# Patient Record
Sex: Female | Born: 1935 | Race: White | Hispanic: No
Health system: Midwestern US, Community
[De-identification: ages and names within clinical notes are randomized; demographics above are authoritative.]

## PROBLEM LIST (undated history)

## (undated) DIAGNOSIS — K219 Gastro-esophageal reflux disease without esophagitis: Secondary | ICD-10-CM

## (undated) DIAGNOSIS — D500 Iron deficiency anemia secondary to blood loss (chronic): Secondary | ICD-10-CM

## (undated) DIAGNOSIS — F0390 Unspecified dementia without behavioral disturbance: Secondary | ICD-10-CM

## (undated) DIAGNOSIS — N189 Chronic kidney disease, unspecified: Secondary | ICD-10-CM

## (undated) DIAGNOSIS — I10 Essential (primary) hypertension: Secondary | ICD-10-CM

## (undated) DIAGNOSIS — E039 Hypothyroidism, unspecified: Secondary | ICD-10-CM

## (undated) DIAGNOSIS — I2510 Atherosclerotic heart disease of native coronary artery without angina pectoris: Secondary | ICD-10-CM

## (undated) DIAGNOSIS — C189 Malignant neoplasm of colon, unspecified: Secondary | ICD-10-CM

## (undated) HISTORY — DX: Hypothyroidism, unspecified: E03.9

## (undated) HISTORY — PX: HEMICOLECTOMY: SHX854

## (undated) HISTORY — DX: Malignant neoplasm of colon, unspecified: C18.9

## (undated) HISTORY — DX: Iron deficiency anemia secondary to blood loss (chronic): D50.0

## (undated) HISTORY — DX: Unspecified dementia, unspecified severity, without behavioral disturbance, psychotic disturbance, mood disturbance, and anxiety: F03.90

## (undated) HISTORY — PX: ABDOMINAL HYSTERECTOMY: SHX81

## (undated) HISTORY — DX: Atherosclerotic heart disease of native coronary artery without angina pectoris: I25.10

## (undated) HISTORY — DX: Essential (primary) hypertension: I10

## (undated) HISTORY — DX: Chronic kidney disease, unspecified: N18.9

## (undated) HISTORY — DX: Gastro-esophageal reflux disease without esophagitis: K21.9

---

## 2007-04-13 ENCOUNTER — Other Ambulatory Visit: Payer: Self-pay

## 2007-04-13 ENCOUNTER — Emergency Department: Payer: Self-pay | Admitting: Emergency Medicine

## 2008-10-25 ENCOUNTER — Emergency Department: Payer: Self-pay | Admitting: Emergency Medicine

## 2010-05-15 ENCOUNTER — Encounter: Payer: Self-pay | Admitting: Orthopedic Surgery

## 2010-05-20 ENCOUNTER — Encounter: Payer: Self-pay | Admitting: Orthopedic Surgery

## 2010-06-19 ENCOUNTER — Encounter: Payer: Self-pay | Admitting: Orthopedic Surgery

## 2010-07-20 ENCOUNTER — Encounter: Payer: Self-pay | Admitting: Orthopedic Surgery

## 2010-08-20 ENCOUNTER — Encounter: Payer: Self-pay | Admitting: Orthopedic Surgery

## 2011-11-24 ENCOUNTER — Emergency Department: Payer: Self-pay | Admitting: Emergency Medicine

## 2012-03-06 ENCOUNTER — Encounter: Payer: Self-pay | Admitting: Orthopedic Surgery

## 2012-07-14 ENCOUNTER — Encounter: Payer: Self-pay | Admitting: Internal Medicine

## 2012-09-04 ENCOUNTER — Inpatient Hospital Stay: Payer: Self-pay | Admitting: Specialist

## 2012-09-04 ENCOUNTER — Ambulatory Visit: Payer: Self-pay | Admitting: Internal Medicine

## 2012-09-04 LAB — COMPREHENSIVE METABOLIC PANEL
Albumin: 3.6 g/dL (ref 3.4–5.0)
Alkaline Phosphatase: 98 U/L (ref 50–136)
Anion Gap: 10 (ref 7–16)
BUN: 24 mg/dL — ABNORMAL HIGH (ref 7–18)
Bilirubin,Total: 0.4 mg/dL (ref 0.2–1.0)
Calcium, Total: 9.2 mg/dL (ref 8.5–10.1)
Chloride: 103 mmol/L (ref 98–107)
Co2: 24 mmol/L (ref 21–32)
Creatinine: 1.53 mg/dL — ABNORMAL HIGH (ref 0.60–1.30)
EGFR (African American): 35 — ABNORMAL LOW
EGFR (Non-African Amer.): 30 — ABNORMAL LOW
Glucose: 96 mg/dL (ref 65–99)
Osmolality: 278 (ref 275–301)
Potassium: 3.9 mmol/L (ref 3.5–5.1)
SGOT(AST): 19 U/L (ref 15–37)
SGPT (ALT): 14 U/L (ref 12–78)
Sodium: 137 mmol/L (ref 136–145)
Total Protein: 8.1 g/dL (ref 6.4–8.2)

## 2012-09-04 LAB — CBC WITH DIFFERENTIAL/PLATELET
Basophil #: 0.1 10*3/uL (ref 0.0–0.1)
Basophil %: 0.9 %
Eosinophil #: 0.5 10*3/uL (ref 0.0–0.7)
Eosinophil %: 6.5 %
HCT: 30.7 % — ABNORMAL LOW (ref 35.0–47.0)
HGB: 10 g/dL — ABNORMAL LOW (ref 12.0–16.0)
Lymphocyte #: 2.2 10*3/uL (ref 1.0–3.6)
Lymphocyte %: 28.4 %
MCH: 25.1 pg — ABNORMAL LOW (ref 26.0–34.0)
MCHC: 32.7 g/dL (ref 32.0–36.0)
MCV: 77 fL — ABNORMAL LOW (ref 80–100)
Monocyte #: 0.8 x10 3/mm (ref 0.2–0.9)
Monocyte %: 10.2 %
Neutrophil #: 4.2 10*3/uL (ref 1.4–6.5)
Neutrophil %: 54 %
Platelet: 322 10*3/uL (ref 150–440)
RBC: 3.99 10*6/uL (ref 3.80–5.20)
RDW: 16.9 % — ABNORMAL HIGH (ref 11.5–14.5)
WBC: 7.7 10*3/uL (ref 3.6–11.0)

## 2012-09-05 LAB — CBC WITH DIFFERENTIAL/PLATELET
Basophil #: 0.1 10*3/uL (ref 0.0–0.1)
Basophil %: 1.1 %
Eosinophil #: 0.7 10*3/uL (ref 0.0–0.7)
Eosinophil %: 12 %
HCT: 26.2 % — ABNORMAL LOW (ref 35.0–47.0)
HGB: 8.4 g/dL — ABNORMAL LOW (ref 12.0–16.0)
Lymphocyte #: 2 10*3/uL (ref 1.0–3.6)
Lymphocyte %: 37.3 %
MCH: 24.8 pg — ABNORMAL LOW (ref 26.0–34.0)
MCHC: 32.2 g/dL (ref 32.0–36.0)
MCV: 77 fL — ABNORMAL LOW (ref 80–100)
Monocyte #: 0.7 x10 3/mm (ref 0.2–0.9)
Monocyte %: 12.4 %
Neutrophil #: 2 10*3/uL (ref 1.4–6.5)
Neutrophil %: 37.2 %
Platelet: 243 10*3/uL (ref 150–440)
RBC: 3.4 10*6/uL — ABNORMAL LOW (ref 3.80–5.20)
RDW: 16.8 % — ABNORMAL HIGH (ref 11.5–14.5)
WBC: 5.4 10*3/uL (ref 3.6–11.0)

## 2012-09-05 LAB — BASIC METABOLIC PANEL
Anion Gap: 13 (ref 7–16)
BUN: 24 mg/dL — ABNORMAL HIGH (ref 7–18)
Calcium, Total: 8.4 mg/dL — ABNORMAL LOW (ref 8.5–10.1)
Chloride: 107 mmol/L (ref 98–107)
Co2: 21 mmol/L (ref 21–32)
Creatinine: 1.83 mg/dL — ABNORMAL HIGH (ref 0.60–1.30)
EGFR (African American): 28 — ABNORMAL LOW
EGFR (Non-African Amer.): 25 — ABNORMAL LOW
Glucose: 119 mg/dL — ABNORMAL HIGH (ref 65–99)
Osmolality: 286 (ref 275–301)
Potassium: 3.4 mmol/L — ABNORMAL LOW (ref 3.5–5.1)
Sodium: 141 mmol/L (ref 136–145)

## 2012-09-06 LAB — CBC WITH DIFFERENTIAL/PLATELET
Basophil #: 0.1 10*3/uL (ref 0.0–0.1)
Basophil %: 1.1 %
Eosinophil #: 0.6 10*3/uL (ref 0.0–0.7)
Eosinophil %: 11.1 %
HCT: 27.1 % — ABNORMAL LOW (ref 35.0–47.0)
HGB: 8.8 g/dL — ABNORMAL LOW (ref 12.0–16.0)
Lymphocyte #: 1.6 10*3/uL (ref 1.0–3.6)
Lymphocyte %: 28.6 %
MCH: 25 pg — ABNORMAL LOW (ref 26.0–34.0)
MCHC: 32.6 g/dL (ref 32.0–36.0)
MCV: 77 fL — ABNORMAL LOW (ref 80–100)
Monocyte #: 0.8 x10 3/mm (ref 0.2–0.9)
Monocyte %: 13.7 %
Neutrophil #: 2.6 10*3/uL (ref 1.4–6.5)
Neutrophil %: 45.5 %
Platelet: 243 10*3/uL (ref 150–440)
RBC: 3.53 10*6/uL — ABNORMAL LOW (ref 3.80–5.20)
RDW: 16.7 % — ABNORMAL HIGH (ref 11.5–14.5)
WBC: 5.6 10*3/uL (ref 3.6–11.0)

## 2012-09-06 LAB — BASIC METABOLIC PANEL
Anion Gap: 9 (ref 7–16)
BUN: 18 mg/dL (ref 7–18)
Calcium, Total: 8.8 mg/dL (ref 8.5–10.1)
Chloride: 107 mmol/L (ref 98–107)
Co2: 22 mmol/L (ref 21–32)
Creatinine: 1.57 mg/dL — ABNORMAL HIGH (ref 0.60–1.30)
EGFR (African American): 34 — ABNORMAL LOW
EGFR (Non-African Amer.): 30 — ABNORMAL LOW
Glucose: 91 mg/dL (ref 65–99)
Osmolality: 277 (ref 275–301)
Potassium: 3.5 mmol/L (ref 3.5–5.1)
Sodium: 138 mmol/L (ref 136–145)

## 2012-09-07 LAB — TSH: Thyroid Stimulating Horm: 9.9 u[IU]/mL — ABNORMAL HIGH

## 2012-09-07 LAB — WBCS, STOOL

## 2012-09-10 LAB — STOOL CULTURE

## 2012-09-10 LAB — CULTURE, BLOOD (SINGLE)

## 2014-01-08 ENCOUNTER — Ambulatory Visit: Payer: Self-pay | Admitting: Oncology

## 2014-01-08 LAB — COMPREHENSIVE METABOLIC PANEL
Albumin: 3.3 g/dL — ABNORMAL LOW (ref 3.4–5.0)
Alkaline Phosphatase: 92 U/L
Anion Gap: 12 (ref 7–16)
BUN: 27 mg/dL — ABNORMAL HIGH (ref 7–18)
Bilirubin,Total: 0.2 mg/dL (ref 0.2–1.0)
Calcium, Total: 9 mg/dL (ref 8.5–10.1)
Chloride: 101 mmol/L (ref 98–107)
Co2: 25 mmol/L (ref 21–32)
Creatinine: 1.94 mg/dL — ABNORMAL HIGH (ref 0.60–1.30)
EGFR (African American): 26 — ABNORMAL LOW
EGFR (Non-African Amer.): 23 — ABNORMAL LOW
Glucose: 106 mg/dL — ABNORMAL HIGH (ref 65–99)
Osmolality: 281 (ref 275–301)
Potassium: 4.4 mmol/L (ref 3.5–5.1)
SGOT(AST): 17 U/L (ref 15–37)
SGPT (ALT): 12 U/L (ref 12–78)
Sodium: 138 mmol/L (ref 136–145)
Total Protein: 8.2 g/dL (ref 6.4–8.2)

## 2014-01-08 LAB — CBC CANCER CENTER
Basophil #: 0.1 x10 3/mm (ref 0.0–0.1)
Basophil %: 1.2 %
Eosinophil #: 0.2 x10 3/mm (ref 0.0–0.7)
Eosinophil %: 4.1 %
HCT: 23.6 % — ABNORMAL LOW (ref 35.0–47.0)
HGB: 7.2 g/dL — ABNORMAL LOW (ref 12.0–16.0)
Lymphocyte #: 1.6 x10 3/mm (ref 1.0–3.6)
Lymphocyte %: 26.5 %
MCH: 22.9 pg — ABNORMAL LOW (ref 26.0–34.0)
MCHC: 30.6 g/dL — ABNORMAL LOW (ref 32.0–36.0)
MCV: 75 fL — ABNORMAL LOW (ref 80–100)
Monocyte #: 0.5 x10 3/mm (ref 0.2–0.9)
Monocyte %: 8.4 %
Neutrophil #: 3.6 x10 3/mm (ref 1.4–6.5)
Neutrophil %: 59.8 %
Platelet: 345 x10 3/mm (ref 150–440)
RBC: 3.16 10*6/uL — ABNORMAL LOW (ref 3.80–5.20)
RDW: 17.5 % — ABNORMAL HIGH (ref 11.5–14.5)
WBC: 6 x10 3/mm (ref 3.6–11.0)

## 2014-01-08 LAB — IRON AND TIBC
Iron Bind.Cap.(Total): 456 ug/dL — ABNORMAL HIGH (ref 250–450)
Iron Saturation: 6 %
Iron: 27 ug/dL — ABNORMAL LOW (ref 50–170)
Unbound Iron-Bind.Cap.: 429 ug/dL

## 2014-01-08 LAB — FOLATE: Folic Acid: 20.7 ng/mL (ref 3.1–100.0)

## 2014-01-08 LAB — RETICULOCYTES
Absolute Retic Count: 0.0682 10*6/uL (ref 0.019–0.186)
Reticulocyte: 2.16 % (ref 0.4–3.1)

## 2014-01-08 LAB — FERRITIN: Ferritin (ARMC): 16 ng/mL (ref 8–388)

## 2014-01-10 LAB — KAPPA/LAMBDA FREE LIGHT CHAINS (ARMC)

## 2014-01-10 LAB — PROT IMMUNOELECTROPHORES(ARMC)

## 2014-01-17 LAB — COMPREHENSIVE METABOLIC PANEL
Albumin: 3.4 g/dL (ref 3.4–5.0)
Alkaline Phosphatase: 88 U/L
Anion Gap: 12 (ref 7–16)
BUN: 28 mg/dL — ABNORMAL HIGH (ref 7–18)
Bilirubin,Total: 0.2 mg/dL (ref 0.2–1.0)
Calcium, Total: 8.6 mg/dL (ref 8.5–10.1)
Chloride: 104 mmol/L (ref 98–107)
Co2: 24 mmol/L (ref 21–32)
Creatinine: 1.9 mg/dL — ABNORMAL HIGH (ref 0.60–1.30)
EGFR (African American): 27 — ABNORMAL LOW
EGFR (Non-African Amer.): 23 — ABNORMAL LOW
Glucose: 104 mg/dL — ABNORMAL HIGH (ref 65–99)
Osmolality: 285 (ref 275–301)
Potassium: 4.1 mmol/L (ref 3.5–5.1)
SGOT(AST): 17 U/L (ref 15–37)
SGPT (ALT): 13 U/L (ref 12–78)
Sodium: 140 mmol/L (ref 136–145)
Total Protein: 7.7 g/dL (ref 6.4–8.2)

## 2014-01-17 LAB — CBC CANCER CENTER
Basophil #: 0.1 x10 3/mm (ref 0.0–0.1)
Basophil %: 1.4 %
Eosinophil #: 0.2 x10 3/mm (ref 0.0–0.7)
Eosinophil %: 3.8 %
HCT: 30.7 % — ABNORMAL LOW (ref 35.0–47.0)
HGB: 9.5 g/dL — ABNORMAL LOW (ref 12.0–16.0)
Lymphocyte #: 1.7 x10 3/mm (ref 1.0–3.6)
Lymphocyte %: 29.2 %
MCH: 24.6 pg — ABNORMAL LOW (ref 26.0–34.0)
MCHC: 30.9 g/dL — ABNORMAL LOW (ref 32.0–36.0)
MCV: 80 fL (ref 80–100)
Monocyte #: 0.7 x10 3/mm (ref 0.2–0.9)
Monocyte %: 12 %
Neutrophil #: 3.1 x10 3/mm (ref 1.4–6.5)
Neutrophil %: 53.6 %
Platelet: 293 x10 3/mm (ref 150–440)
RBC: 3.87 10*6/uL (ref 3.80–5.20)
RDW: 19.9 % — ABNORMAL HIGH (ref 11.5–14.5)
WBC: 5.8 x10 3/mm (ref 3.6–11.0)

## 2014-01-20 ENCOUNTER — Ambulatory Visit: Payer: Self-pay | Admitting: Oncology

## 2014-02-04 LAB — CK TOTAL AND CKMB (NOT AT ARMC)
CK, Total: 86 U/L
CK-MB: 1.1 ng/mL (ref 0.5–3.6)

## 2014-02-04 LAB — CBC WITH DIFFERENTIAL/PLATELET
Basophil #: 0 10*3/uL (ref 0.0–0.1)
Basophil %: 0.3 %
Eosinophil #: 0.2 10*3/uL (ref 0.0–0.7)
Eosinophil %: 2.5 %
HCT: 31.4 % — ABNORMAL LOW (ref 35.0–47.0)
HGB: 10 g/dL — ABNORMAL LOW (ref 12.0–16.0)
Lymphocyte #: 3.7 10*3/uL — ABNORMAL HIGH (ref 1.0–3.6)
Lymphocyte %: 47.3 %
MCH: 25.5 pg — ABNORMAL LOW (ref 26.0–34.0)
MCHC: 31.9 g/dL — ABNORMAL LOW (ref 32.0–36.0)
MCV: 80 fL (ref 80–100)
Monocyte #: 0.9 x10 3/mm (ref 0.2–0.9)
Monocyte %: 12 %
Neutrophil #: 3 10*3/uL (ref 1.4–6.5)
Neutrophil %: 37.9 %
Platelet: 254 10*3/uL (ref 150–440)
RBC: 3.93 10*6/uL (ref 3.80–5.20)
RDW: 20.7 % — ABNORMAL HIGH (ref 11.5–14.5)
WBC: 7.8 10*3/uL (ref 3.6–11.0)

## 2014-02-04 LAB — BASIC METABOLIC PANEL
Anion Gap: 8 (ref 7–16)
BUN: 31 mg/dL — ABNORMAL HIGH (ref 7–18)
Calcium, Total: 9.8 mg/dL (ref 8.5–10.1)
Chloride: 109 mmol/L — ABNORMAL HIGH (ref 98–107)
Co2: 23 mmol/L (ref 21–32)
Creatinine: 1.82 mg/dL — ABNORMAL HIGH (ref 0.60–1.30)
EGFR (African American): 28 — ABNORMAL LOW
EGFR (Non-African Amer.): 24 — ABNORMAL LOW
Glucose: 90 mg/dL (ref 65–99)
Osmolality: 285 (ref 275–301)
Potassium: 3.6 mmol/L (ref 3.5–5.1)
Sodium: 140 mmol/L (ref 136–145)

## 2014-02-04 LAB — TROPONIN I
Troponin-I: <0.02 ng/mL
Troponin-I: 13 ng/mL — ABNORMAL HIGH

## 2014-02-04 LAB — PROTIME-INR
INR: 0.9
Prothrombin Time: 12.4 secs (ref 11.5–14.7)

## 2014-02-04 LAB — TSH: Thyroid Stimulating Horm: 2.33 u[IU]/mL

## 2014-02-04 LAB — APTT: Activated PTT: 29.2 secs (ref 23.6–35.9)

## 2014-02-05 LAB — URINALYSIS, COMPLETE
Bacteria: NONE SEEN
Bilirubin,UR: NEGATIVE
Blood: NEGATIVE
Glucose,UR: NEGATIVE mg/dL (ref 0–75)
Ketone: NEGATIVE
Leukocyte Esterase: NEGATIVE
Nitrite: NEGATIVE
Ph: 5 (ref 4.5–8.0)
Protein: 100
RBC,UR: NONE SEEN /HPF (ref 0–5)
Specific Gravity: 1.017 (ref 1.003–1.030)
Squamous Epithelial: <1
WBC UR: NONE SEEN /HPF (ref 0–5)

## 2014-02-05 LAB — CK-MB
CK-MB: 1.2 ng/mL (ref 0.5–3.6)
CK-MB: 32.3 ng/mL — ABNORMAL HIGH (ref 0.5–3.6)
CK-MB: 33.5 ng/mL — ABNORMAL HIGH (ref 0.5–3.6)

## 2014-02-05 LAB — TROPONIN I: Troponin-I: 24 ng/mL — ABNORMAL HIGH

## 2014-02-06 ENCOUNTER — Ambulatory Visit (HOSPITAL_COMMUNITY)
Admission: AD | Admit: 2014-02-06 | Discharge: 2014-02-06 | Disposition: A | Discharge status: Home / Self Care | Payer: Medicare Other | Source: Other Acute Inpatient Hospital | Attending: Internal Medicine | Admitting: Internal Medicine

## 2014-02-06 ENCOUNTER — Inpatient Hospital Stay: Payer: Self-pay | Admitting: Internal Medicine

## 2014-02-06 DIAGNOSIS — I2510 Atherosclerotic heart disease of native coronary artery without angina pectoris: Secondary | ICD-10-CM | POA: Insufficient documentation

## 2014-02-06 DIAGNOSIS — R079 Chest pain, unspecified: Secondary | ICD-10-CM | POA: Insufficient documentation

## 2014-02-07 ENCOUNTER — Ambulatory Visit: Payer: Self-pay | Admitting: Oncology

## 2014-03-21 ENCOUNTER — Ambulatory Visit: Payer: Self-pay | Admitting: Oncology

## 2014-05-30 ENCOUNTER — Inpatient Hospital Stay: Payer: Self-pay | Admitting: Internal Medicine

## 2014-05-30 LAB — URINALYSIS, COMPLETE
Bilirubin,UR: NEGATIVE
Blood: NEGATIVE
Glucose,UR: NEGATIVE mg/dL (ref 0–75)
Ketone: NEGATIVE
Leukocyte Esterase: NEGATIVE
Nitrite: NEGATIVE
Ph: 7 (ref 4.5–8.0)
Protein: 100
RBC,UR: <1 /HPF (ref 0–5)
Specific Gravity: 1.014 (ref 1.003–1.030)
Squamous Epithelial: 3
WBC UR: 1 /HPF (ref 0–5)

## 2014-05-30 LAB — CBC
HCT: 16.2 % — ABNORMAL LOW (ref 35.0–47.0)
HGB: 5.3 g/dL — ABNORMAL LOW (ref 12.0–16.0)
MCH: 26.7 pg (ref 26.0–34.0)
MCHC: 32.5 g/dL (ref 32.0–36.0)
MCV: 82 fL (ref 80–100)
Platelet: 283 10*3/uL (ref 150–440)
RBC: 1.97 10*6/uL — ABNORMAL LOW (ref 3.80–5.20)
RDW: 17.5 % — ABNORMAL HIGH (ref 11.5–14.5)
WBC: 5.3 10*3/uL (ref 3.6–11.0)

## 2014-05-30 LAB — COMPREHENSIVE METABOLIC PANEL
Albumin: 3 g/dL — ABNORMAL LOW (ref 3.4–5.0)
Alkaline Phosphatase: 61 U/L
Anion Gap: 6 — ABNORMAL LOW (ref 7–16)
BUN: 36 mg/dL — ABNORMAL HIGH (ref 7–18)
Bilirubin,Total: 0.2 mg/dL (ref 0.2–1.0)
Calcium, Total: 8.6 mg/dL (ref 8.5–10.1)
Chloride: 107 mmol/L (ref 98–107)
Co2: 24 mmol/L (ref 21–32)
Creatinine: 2.1 mg/dL — ABNORMAL HIGH (ref 0.60–1.30)
EGFR (African American): 24 — ABNORMAL LOW
EGFR (Non-African Amer.): 20 — ABNORMAL LOW
Glucose: 90 mg/dL (ref 65–99)
Osmolality: 282 (ref 275–301)
Potassium: 4.8 mmol/L (ref 3.5–5.1)
SGOT(AST): 21 U/L (ref 15–37)
SGPT (ALT): 14 U/L (ref 12–78)
Sodium: 137 mmol/L (ref 136–145)
Total Protein: 6.6 g/dL (ref 6.4–8.2)

## 2014-05-30 LAB — TROPONIN I: Troponin-I: <0.02 ng/mL

## 2014-05-31 LAB — CBC WITH DIFFERENTIAL/PLATELET
Basophil #: 0.1 10*3/uL (ref 0.0–0.1)
Basophil %: 0.9 %
Eosinophil #: 0.2 10*3/uL (ref 0.0–0.7)
Eosinophil %: 3.1 %
HCT: 26.8 % — ABNORMAL LOW (ref 35.0–47.0)
HGB: 9 g/dL — ABNORMAL LOW (ref 12.0–16.0)
Lymphocyte #: 1 10*3/uL (ref 1.0–3.6)
Lymphocyte %: 17.8 %
MCH: 28.5 pg (ref 26.0–34.0)
MCHC: 33.6 g/dL (ref 32.0–36.0)
MCV: 85 fL (ref 80–100)
Monocyte #: 0.8 x10 3/mm (ref 0.2–0.9)
Monocyte %: 13 %
Neutrophil #: 3.8 10*3/uL (ref 1.4–6.5)
Neutrophil %: 65.2 %
Platelet: 254 10*3/uL (ref 150–440)
RBC: 3.15 10*6/uL — ABNORMAL LOW (ref 3.80–5.20)
RDW: 15.6 % — ABNORMAL HIGH (ref 11.5–14.5)
WBC: 5.8 10*3/uL (ref 3.6–11.0)

## 2014-06-12 ENCOUNTER — Ambulatory Visit: Payer: Self-pay | Admitting: Oncology

## 2014-06-12 LAB — RETICULOCYTES
Absolute Retic Count: 0.1336 10*6/uL (ref 0.019–0.186)
Reticulocyte: 2.63 % (ref 0.4–3.1)

## 2014-06-12 LAB — CANCER CENTER HEMOGLOBIN: HGB: 6.4 g/dL — ABNORMAL LOW (ref 12.0–16.0)

## 2014-06-12 LAB — IRON AND TIBC
Iron Bind.Cap.(Total): 360 ug/dL (ref 250–450)
Iron Saturation: 6 %
Iron: 23 ug/dL — ABNORMAL LOW (ref 50–170)
Unbound Iron-Bind.Cap.: 337 ug/dL

## 2014-06-12 LAB — LACTATE DEHYDROGENASE: LDH: 173 U/L (ref 81–246)

## 2014-06-12 LAB — FOLATE: Folic Acid: 16.2 ng/mL (ref 3.1–100.0)

## 2014-06-19 ENCOUNTER — Ambulatory Visit: Payer: Self-pay | Admitting: Oncology

## 2014-06-26 ENCOUNTER — Inpatient Hospital Stay: Payer: Self-pay | Admitting: Internal Medicine

## 2014-06-26 LAB — CBC CANCER CENTER
Basophil #: 0.1 x10 3/mm (ref 0.0–0.1)
Basophil %: 0.9 %
Eosinophil #: 0.1 x10 3/mm (ref 0.0–0.7)
Eosinophil %: 2 %
HCT: 16.6 % — ABNORMAL LOW (ref 35.0–47.0)
HGB: 5.3 g/dL — ABNORMAL LOW (ref 12.0–16.0)
Lymphocyte #: 0.7 x10 3/mm — ABNORMAL LOW (ref 1.0–3.6)
Lymphocyte %: 11.7 %
MCH: 27.4 pg (ref 26.0–34.0)
MCHC: 32 g/dL (ref 32.0–36.0)
MCV: 86 fL (ref 80–100)
Monocyte #: 0.4 x10 3/mm (ref 0.2–0.9)
Monocyte %: 7 %
Neutrophil #: 4.9 x10 3/mm (ref 1.4–6.5)
Neutrophil %: 78.4 %
Platelet: 272 x10 3/mm (ref 150–440)
RBC: 1.94 10*6/uL — ABNORMAL LOW (ref 3.80–5.20)
RDW: 16.6 % — ABNORMAL HIGH (ref 11.5–14.5)
WBC: 6.3 x10 3/mm (ref 3.6–11.0)

## 2014-06-26 LAB — BASIC METABOLIC PANEL
Anion Gap: 9 (ref 7–16)
BUN: 37 mg/dL — ABNORMAL HIGH (ref 7–18)
Calcium, Total: 8.2 mg/dL — ABNORMAL LOW (ref 8.5–10.1)
Chloride: 107 mmol/L (ref 98–107)
Co2: 24 mmol/L (ref 21–32)
Creatinine: 1.92 mg/dL — ABNORMAL HIGH (ref 0.60–1.30)
EGFR (African American): 26 — ABNORMAL LOW
EGFR (Non-African Amer.): 23 — ABNORMAL LOW
Glucose: 91 mg/dL (ref 65–99)
Osmolality: 288 (ref 275–301)
Potassium: 5 mmol/L (ref 3.5–5.1)
Sodium: 140 mmol/L (ref 136–145)

## 2014-06-26 LAB — OCCULT BLOOD X 1 CARD TO LAB, STOOL: Occult Blood, Feces: POSITIVE

## 2014-06-27 LAB — BASIC METABOLIC PANEL
Anion Gap: 6 — ABNORMAL LOW (ref 7–16)
BUN: 29 mg/dL — ABNORMAL HIGH (ref 7–18)
Calcium, Total: 8.4 mg/dL — ABNORMAL LOW (ref 8.5–10.1)
Chloride: 108 mmol/L — ABNORMAL HIGH (ref 98–107)
Co2: 24 mmol/L (ref 21–32)
Creatinine: 1.77 mg/dL — ABNORMAL HIGH (ref 0.60–1.30)
EGFR (African American): 29 — ABNORMAL LOW
EGFR (Non-African Amer.): 25 — ABNORMAL LOW
Glucose: 95 mg/dL (ref 65–99)
Osmolality: 281 (ref 275–301)
Potassium: 4.7 mmol/L (ref 3.5–5.1)
Sodium: 138 mmol/L (ref 136–145)

## 2014-06-27 LAB — CBC WITH DIFFERENTIAL/PLATELET
Basophil #: 0 10*3/uL (ref 0.0–0.1)
Basophil %: 0.8 %
Eosinophil #: 0.2 10*3/uL (ref 0.0–0.7)
Eosinophil %: 3 %
HCT: 23.4 % — ABNORMAL LOW (ref 35.0–47.0)
HGB: 8 g/dL — ABNORMAL LOW (ref 12.0–16.0)
Lymphocyte #: 1.1 10*3/uL (ref 1.0–3.6)
Lymphocyte %: 18.8 %
MCH: 29.7 pg (ref 26.0–34.0)
MCHC: 34.1 g/dL (ref 32.0–36.0)
MCV: 87 fL (ref 80–100)
Monocyte #: 0.6 x10 3/mm (ref 0.2–0.9)
Monocyte %: 9.8 %
Neutrophil #: 4.1 10*3/uL (ref 1.4–6.5)
Neutrophil %: 67.6 %
Platelet: 213 10*3/uL (ref 150–440)
RBC: 2.69 10*6/uL — ABNORMAL LOW (ref 3.80–5.20)
RDW: 17.8 % — ABNORMAL HIGH (ref 11.5–14.5)
WBC: 6 10*3/uL (ref 3.6–11.0)

## 2014-06-27 LAB — TSH: Thyroid Stimulating Horm: 2.39 u[IU]/mL

## 2014-06-28 LAB — CBC WITH DIFFERENTIAL/PLATELET
Basophil #: 0 x10 3/mm 3
Basophil %: 0.6 %
Eosinophil #: 0.3 x10 3/mm 3
Eosinophil %: 4.8 %
HCT: 23.7 % — ABNORMAL LOW
HGB: 8.2 g/dL — ABNORMAL LOW
Lymphocyte %: 17.8 %
Lymphs Abs: 1.1 x10 3/mm 3
MCH: 30.4 pg
MCHC: 34.7 g/dL
MCV: 88 fL
Monocyte #: 0.8 "x10 3/mm "
Monocyte %: 12.2 %
Neutrophil #: 4 x10 3/mm 3
Neutrophil %: 64.6 %
Platelet: 185 x10 3/mm 3
RBC: 2.71 X10 6/mm 3 — ABNORMAL LOW
RDW: 16.9 % — ABNORMAL HIGH
WBC: 6.1 x10 3/mm 3

## 2014-06-28 LAB — BASIC METABOLIC PANEL
Anion Gap: 7 (ref 7–16)
BUN: 26 mg/dL — ABNORMAL HIGH (ref 7–18)
Calcium, Total: 8.3 mg/dL — ABNORMAL LOW (ref 8.5–10.1)
Chloride: 110 mmol/L — ABNORMAL HIGH (ref 98–107)
Co2: 22 mmol/L (ref 21–32)
Creatinine: 1.77 mg/dL — ABNORMAL HIGH (ref 0.60–1.30)
EGFR (African American): 29 — ABNORMAL LOW
EGFR (Non-African Amer.): 25 — ABNORMAL LOW
Glucose: 89 mg/dL (ref 65–99)
Osmolality: 282 (ref 275–301)
Potassium: 4.5 mmol/L (ref 3.5–5.1)
Sodium: 139 mmol/L (ref 136–145)

## 2014-06-29 LAB — HEMOGLOBIN: HGB: 9.2 g/dL — ABNORMAL LOW (ref 12.0–16.0)

## 2014-07-10 ENCOUNTER — Ambulatory Visit: Payer: Self-pay | Admitting: Oncology

## 2014-07-10 LAB — CBC CANCER CENTER
Basophil #: 0.1 x10 3/mm (ref 0.0–0.1)
Basophil %: 1.2 %
Eosinophil #: 0.3 x10 3/mm (ref 0.0–0.7)
Eosinophil %: 5.8 %
HCT: 29.6 % — ABNORMAL LOW (ref 35.0–47.0)
HGB: 9.6 g/dL — ABNORMAL LOW (ref 12.0–16.0)
Lymphocyte #: 1.3 x10 3/mm (ref 1.0–3.6)
Lymphocyte %: 26.2 %
MCH: 28.9 pg (ref 26.0–34.0)
MCHC: 32.2 g/dL (ref 32.0–36.0)
MCV: 90 fL (ref 80–100)
Monocyte #: 0.7 x10 3/mm (ref 0.2–0.9)
Monocyte %: 12.9 %
Neutrophil #: 2.8 x10 3/mm (ref 1.4–6.5)
Neutrophil %: 53.9 %
Platelet: 237 x10 3/mm (ref 150–440)
RBC: 3.31 10*6/uL — ABNORMAL LOW (ref 3.80–5.20)
RDW: 17.9 % — ABNORMAL HIGH (ref 11.5–14.5)
WBC: 5.1 x10 3/mm (ref 3.6–11.0)

## 2014-07-17 LAB — CBC CANCER CENTER
Basophil #: 0.1 x10 3/mm (ref 0.0–0.1)
Basophil %: 1 %
Eosinophil #: 0.3 x10 3/mm (ref 0.0–0.7)
Eosinophil %: 5.3 %
HCT: 28.3 % — ABNORMAL LOW (ref 35.0–47.0)
HGB: 9.3 g/dL — ABNORMAL LOW (ref 12.0–16.0)
Lymphocyte #: 1.3 x10 3/mm (ref 1.0–3.6)
Lymphocyte %: 23.4 %
MCH: 30 pg (ref 26.0–34.0)
MCHC: 32.8 g/dL (ref 32.0–36.0)
MCV: 92 fL (ref 80–100)
Monocyte #: 0.8 x10 3/mm (ref 0.2–0.9)
Monocyte %: 14.7 %
Neutrophil #: 3.1 x10 3/mm (ref 1.4–6.5)
Neutrophil %: 55.6 %
Platelet: 265 x10 3/mm (ref 150–440)
RBC: 3.09 10*6/uL — ABNORMAL LOW (ref 3.80–5.20)
RDW: 18.7 % — ABNORMAL HIGH (ref 11.5–14.5)
WBC: 5.5 x10 3/mm (ref 3.6–11.0)

## 2014-07-17 LAB — COMPREHENSIVE METABOLIC PANEL
Albumin: 3.2 g/dL — ABNORMAL LOW (ref 3.4–5.0)
Alkaline Phosphatase: 82 U/L
Anion Gap: 9 (ref 7–16)
BUN: 31 mg/dL — ABNORMAL HIGH (ref 7–18)
Bilirubin,Total: 0.5 mg/dL (ref 0.2–1.0)
Calcium, Total: 8.9 mg/dL (ref 8.5–10.1)
Chloride: 107 mmol/L (ref 98–107)
Co2: 24 mmol/L (ref 21–32)
Creatinine: 1.96 mg/dL — ABNORMAL HIGH (ref 0.60–1.30)
EGFR (African American): 26 — ABNORMAL LOW
EGFR (Non-African Amer.): 22 — ABNORMAL LOW
Glucose: 104 mg/dL — ABNORMAL HIGH (ref 65–99)
Osmolality: 286 (ref 275–301)
Potassium: 4.8 mmol/L (ref 3.5–5.1)
SGOT(AST): 29 U/L (ref 15–37)
SGPT (ALT): 26 U/L
Sodium: 140 mmol/L (ref 136–145)
Total Protein: 7.5 g/dL (ref 6.4–8.2)

## 2014-07-19 ENCOUNTER — Ambulatory Visit: Payer: Self-pay | Admitting: Nephrology

## 2014-07-20 ENCOUNTER — Ambulatory Visit: Payer: Self-pay | Admitting: Oncology

## 2014-08-07 LAB — CBC CANCER CENTER
Basophil #: 0.1 x10 3/mm (ref 0.0–0.1)
Basophil %: 1.2 %
Eosinophil #: 0.5 x10 3/mm (ref 0.0–0.7)
Eosinophil %: 10.3 %
HCT: 29.7 % — ABNORMAL LOW (ref 35.0–47.0)
HGB: 9.7 g/dL — ABNORMAL LOW (ref 12.0–16.0)
Lymphocyte #: 1.1 x10 3/mm (ref 1.0–3.6)
Lymphocyte %: 23.7 %
MCH: 30.2 pg (ref 26.0–34.0)
MCHC: 32.7 g/dL (ref 32.0–36.0)
MCV: 93 fL (ref 80–100)
Monocyte #: 0.5 x10 3/mm (ref 0.2–0.9)
Monocyte %: 10.3 %
Neutrophil #: 2.5 x10 3/mm (ref 1.4–6.5)
Neutrophil %: 54.5 %
Platelet: 246 x10 3/mm (ref 150–440)
RBC: 3.21 10*6/uL — ABNORMAL LOW (ref 3.80–5.20)
RDW: 17.4 % — ABNORMAL HIGH (ref 11.5–14.5)
WBC: 4.5 x10 3/mm (ref 3.6–11.0)

## 2014-08-07 LAB — COMPREHENSIVE METABOLIC PANEL
Albumin: 3.2 g/dL — ABNORMAL LOW (ref 3.4–5.0)
Alkaline Phosphatase: 85 U/L
Anion Gap: 13 (ref 7–16)
BUN: 35 mg/dL — ABNORMAL HIGH (ref 7–18)
Bilirubin,Total: 0.2 mg/dL (ref 0.2–1.0)
Calcium, Total: 8.9 mg/dL (ref 8.5–10.1)
Chloride: 104 mmol/L (ref 98–107)
Co2: 24 mmol/L (ref 21–32)
Creatinine: 1.92 mg/dL — ABNORMAL HIGH (ref 0.60–1.30)
EGFR (African American): 26 — ABNORMAL LOW
EGFR (Non-African Amer.): 23 — ABNORMAL LOW
Glucose: 124 mg/dL — ABNORMAL HIGH (ref 65–99)
Osmolality: 291 (ref 275–301)
Potassium: 4.6 mmol/L (ref 3.5–5.1)
SGOT(AST): 18 U/L (ref 15–37)
SGPT (ALT): 20 U/L
Sodium: 141 mmol/L (ref 136–145)
Total Protein: 7.3 g/dL (ref 6.4–8.2)

## 2014-08-20 ENCOUNTER — Ambulatory Visit: Payer: Self-pay | Admitting: Oncology

## 2014-08-30 ENCOUNTER — Ambulatory Visit: Payer: Self-pay | Admitting: Oncology

## 2014-08-30 LAB — CBC CANCER CENTER
Basophil #: 0.1 x10 3/mm (ref 0.0–0.1)
Basophil %: 1.1 %
Eosinophil #: 0.3 x10 3/mm (ref 0.0–0.7)
Eosinophil %: 5.9 %
HCT: 28.3 % — ABNORMAL LOW (ref 35.0–47.0)
HGB: 9.2 g/dL — ABNORMAL LOW (ref 12.0–16.0)
Lymphocyte #: 1.5 x10 3/mm (ref 1.0–3.6)
Lymphocyte %: 30.3 %
MCH: 30.7 pg (ref 26.0–34.0)
MCHC: 32.7 g/dL (ref 32.0–36.0)
MCV: 94 fL (ref 80–100)
Monocyte #: 0.7 x10 3/mm (ref 0.2–0.9)
Monocyte %: 15.1 %
Neutrophil #: 2.3 x10 3/mm (ref 1.4–6.5)
Neutrophil %: 47.6 %
Platelet: 224 x10 3/mm (ref 150–440)
RBC: 3.01 10*6/uL — ABNORMAL LOW (ref 3.80–5.20)
RDW: 15.2 % — ABNORMAL HIGH (ref 11.5–14.5)
WBC: 4.8 x10 3/mm (ref 3.6–11.0)

## 2014-08-30 LAB — COMPREHENSIVE METABOLIC PANEL
Albumin: 3.2 g/dL — ABNORMAL LOW (ref 3.4–5.0)
Alkaline Phosphatase: 78 U/L
Anion Gap: 8 (ref 7–16)
BUN: 44 mg/dL — ABNORMAL HIGH (ref 7–18)
Bilirubin,Total: 0.2 mg/dL (ref 0.2–1.0)
Calcium, Total: 9.3 mg/dL (ref 8.5–10.1)
Chloride: 103 mmol/L (ref 98–107)
Co2: 28 mmol/L (ref 21–32)
Creatinine: 1.87 mg/dL — ABNORMAL HIGH (ref 0.60–1.30)
EGFR (African American): 27 — ABNORMAL LOW
EGFR (Non-African Amer.): 24 — ABNORMAL LOW
Glucose: 92 mg/dL (ref 65–99)
Osmolality: 288 (ref 275–301)
Potassium: 5.5 mmol/L — ABNORMAL HIGH (ref 3.5–5.1)
SGOT(AST): 24 U/L (ref 15–37)
SGPT (ALT): 20 U/L
Sodium: 139 mmol/L (ref 136–145)
Total Protein: 7.2 g/dL (ref 6.4–8.2)

## 2014-09-14 ENCOUNTER — Emergency Department: Payer: Self-pay | Admitting: Emergency Medicine

## 2014-09-19 ENCOUNTER — Ambulatory Visit: Payer: Self-pay | Admitting: Oncology

## 2014-10-25 ENCOUNTER — Ambulatory Visit: Payer: Self-pay | Admitting: Oncology

## 2014-11-01 LAB — CBC CANCER CENTER
Basophil #: 0 x10 3/mm (ref 0.0–0.1)
Basophil %: 0.9 %
Eosinophil #: 0.2 x10 3/mm (ref 0.0–0.7)
Eosinophil %: 4.2 %
HCT: 32.7 % — ABNORMAL LOW (ref 35.0–47.0)
HGB: 10.9 g/dL — ABNORMAL LOW (ref 12.0–16.0)
Lymphocyte #: 1.8 x10 3/mm (ref 1.0–3.6)
Lymphocyte %: 34.1 %
MCH: 30.2 pg (ref 26.0–34.0)
MCHC: 33.2 g/dL (ref 32.0–36.0)
MCV: 91 fL (ref 80–100)
Monocyte #: 0.5 x10 3/mm (ref 0.2–0.9)
Monocyte %: 9.6 %
Neutrophil #: 2.7 x10 3/mm (ref 1.4–6.5)
Neutrophil %: 51.2 %
Platelet: 245 x10 3/mm (ref 150–440)
RBC: 3.6 10*6/uL — ABNORMAL LOW (ref 3.80–5.20)
RDW: 13.2 % (ref 11.5–14.5)
WBC: 5.3 x10 3/mm (ref 3.6–11.0)

## 2014-11-01 LAB — IRON AND TIBC
Iron Bind.Cap.(Total): 304 ug/dL (ref 250–450)
Iron Saturation: 22 %
Iron: 67 ug/dL (ref 50–170)
Unbound Iron-Bind.Cap.: 237 ug/dL

## 2014-11-01 LAB — FERRITIN: Ferritin (ARMC): 281 ng/mL (ref 8–388)

## 2014-11-19 ENCOUNTER — Ambulatory Visit: Payer: Self-pay | Admitting: Oncology

## 2015-01-21 ENCOUNTER — Ambulatory Visit: Payer: Self-pay | Admitting: Oncology

## 2015-01-22 LAB — CBC CANCER CENTER
BASOS ABS: 0.1 x10 3/mm (ref 0.0–0.1)
Basophil %: 1.2 %
EOS PCT: 6 %
Eosinophil #: 0.4 x10 3/mm (ref 0.0–0.7)
HCT: 24 % — ABNORMAL LOW (ref 35.0–47.0)
HGB: 7.7 g/dL — ABNORMAL LOW (ref 12.0–16.0)
LYMPHS ABS: 1.3 x10 3/mm (ref 1.0–3.6)
Lymphocyte %: 21.2 %
MCH: 27.9 pg (ref 26.0–34.0)
MCHC: 32.1 g/dL (ref 32.0–36.0)
MCV: 87 fL (ref 80–100)
Monocyte #: 0.7 x10 3/mm (ref 0.2–0.9)
Monocyte %: 11 %
Neutrophil #: 3.7 x10 3/mm (ref 1.4–6.5)
Neutrophil %: 60.6 %
PLATELETS: 263 x10 3/mm (ref 150–440)
RBC: 2.76 10*6/uL — ABNORMAL LOW (ref 3.80–5.20)
RDW: 16.1 % — AB (ref 11.5–14.5)
WBC: 6.1 x10 3/mm (ref 3.6–11.0)

## 2015-02-18 ENCOUNTER — Ambulatory Visit
Admit: 2015-02-18 | Disposition: A | Discharge status: Home / Self Care | Payer: Self-pay | Attending: Oncology | Admitting: Oncology

## 2015-03-31 ENCOUNTER — Ambulatory Visit
Admit: 2015-03-31 | Disposition: A | Discharge status: Home / Self Care | Payer: Self-pay | Attending: Oncology | Admitting: Oncology

## 2015-03-31 LAB — CBC CANCER CENTER
Basophil #: 0.1 x10 3/mm (ref 0.0–0.1)
Basophil %: 1 %
EOS PCT: 3.6 %
Eosinophil #: 0.2 x10 3/mm (ref 0.0–0.7)
HCT: 28.7 % — ABNORMAL LOW (ref 35.0–47.0)
HGB: 9.4 g/dL — ABNORMAL LOW (ref 12.0–16.0)
Lymphocyte #: 1.5 x10 3/mm (ref 1.0–3.6)
Lymphocyte %: 21.8 %
MCH: 28.7 pg (ref 26.0–34.0)
MCHC: 32.8 g/dL (ref 32.0–36.0)
MCV: 88 fL (ref 80–100)
MONO ABS: 0.7 x10 3/mm (ref 0.2–0.9)
Monocyte %: 10.2 %
NEUTROS PCT: 63.4 %
Neutrophil #: 4.2 x10 3/mm (ref 1.4–6.5)
PLATELETS: 245 x10 3/mm (ref 150–440)
RBC: 3.28 10*6/uL — AB (ref 3.80–5.20)
RDW: 16.7 % — ABNORMAL HIGH (ref 11.5–14.5)
WBC: 6.7 x10 3/mm (ref 3.6–11.0)

## 2015-04-08 NOTE — Discharge Summary (Signed)
PATIENT NAME:  [name] [name], [name] [name] MR#:  [id number] DATE OF BIRTH:  [birth year]  DATE OF ADMISSION:  09/04/2012 DATE OF DISCHARGE:  09/07/2012  HISTORY: For a detailed note, please take a look at the history and physical done on admission by Dr. Hillary Bow.  DISCHARGE DIAGNOSES: 1. Acute enteritis.  2. Diarrhea secondary to acute enteritis.  3. Hypertension.  4. Hypothyroidism.  5. Gastroesophageal reflux disease.   DIET: The patient is being discharged on a low-sodium, low-fat mechanical soft diet.   ACTIVITY: As tolerated.  DISCHARGE FOLLOWUP: Followup with a new patient appointment with Dr. Ola Spurr from [facility].   DISCHARGE MEDICATIONS:  1. Amlodipine 10 mg daily.  2. Synthroid 75 mcg daily.  3. Nexium 40 mg daily.  4. Tylenol with oxycodone 325/10 mg one tab every 6 hours as needed.  5. Aspirin 81 mg daily. 6. Metoprolol tartrate 25 mg twice a day. 7. Augmentin 500/125 mg one tab twice a day x7 days.   PERTINENT STUDIES DURING HOSPITAL COURSE: CT scan of the abdomen and pelvis done without contrast on admission is showing multiple loops of small bowel in the right hemiabdomen which demonstrates bowel wall thickening and mild adjacent inflammatory changes. Findings are consistent with enteritis. Differential includes infectious versus inflammatory and ischemic.   BRIEF HOSPITAL COURSE: This is an 82 year old female who presented to the hospital with abdominal pain, nausea, vomiting, and diarrhea.  1. Acute enteritis. This was likely initially thought to be viral in nature as the patient was afebrile with no white cell count, although she did have symptoms of abdominal pain and nausea, vomiting, and diarrhea. Her stool cultures were negative for any pathogens and stool for [facility] were negative. Her blood cultures have remained also negative. She was empirically started on Zosyn and her clinical symptoms have improved so this is likely suspected to be infectious as her symptoms  are improved on antibiotics. The patient now is tolerating p.o. and her diarrhea and abdominal pain have significantly improved. Therefore, she is being discharged on some p.o. Augmentin as stated.  2. Hypertension. The patient did have some labile blood pressures during the hospital course. She only takes Norvasc at home. Metoprolol was added to her regimen which seemed to have better control of her blood pressure so she is being discharged both on Norvasc and metoprolol presently.  3. Acute renal failure. This was likely secondary to dehydration from her diarrhea. After getting some IV fluids, her renal function is now back down to baseline.  4. Hypothyroidism. The patient was maintained on her Synthroid. She will resume that.  5. Gastroesophageal reflux disease. The patient was maintained on Nexium. She will also resume that.   CODE STATUS: FULL CODE.   DISPOSITION: She is being discharged home with a new patient followup appointment being made with Dr. Ola Spurr. Her regular doctors are at [facility] but she would prefer to have somebody local to have followup with.   TIME SPENT WITH DISCHARGE: 40 minutes. ____________________________ Belia Heman. Verdell Carmine, MD vjs:slb D: 09/07/2012 15:58:26 ET    T: 09/10/2012 16:07:44 ET        JOB#: [id number] cc: Belia Heman. Verdell Carmine, MD, <Dictator> Cheral Marker. Ola Spurr, MD Henreitta Leber MD ELECTRONICALLY SIGNED 09/15/2012 12:16

## 2015-04-08 NOTE — H&P (Signed)
PATIENT NAME:  [name] [name], [name] [name] MR#:  [id number] DATE OF BIRTH:  [birth year]  DATE OF ADMISSION:  09/04/2012  PRIMARY CARE PHYSICIAN: [facility]   History obtained from patient. Case discussed with Dr. Gilford Raid of Urgent Care. Old records and imaging studies reviewed.   CHIEF COMPLAINT: Right lower quadrant abdominal pain, nausea and vomiting.   HISTORY OF PRESENT ILLNESS: The patient is an 80 year old female patient with history of colon cancer status post right hemicolectomy with last colonoscopy six years back who presents to the hospital today from [facility] after she had right lower quadrant pain, nausea and vomiting for one day with no aggravating or relieving factors, unable to keep anything down, no appetite. The patient's CT scan has shown enteritis with white count of 6.9, afebrile, and the patient is being admitted to the hospitalist service with dehydration and enteritis for IV antibiotics.   PAST MEDICAL HISTORY:  1. Hypothyroidism.  2. CKD, unknown heart disease. 3. Colon cancer, status post resection five years back.   PAST SURGICAL HISTORY:  1. Right hemicolectomy. 2. Hysterectomy with bilateral salpingo-oophorectomy.   ALLERGIES: Flagyl, phenobarbital, ranitidine.   HOME MEDICATIONS: Unknown at this time.   SOCIAL HISTORY: The patient lives with her son. No smoking. No alcohol. No illicit drugs.   CODE STATUS: FULL CODE.   REVIEW OF SYSTEMS: Please see history of present illness. Rest of the systems reviewed and negative.   FAMILY HISTORY: Reviewed. No family history of coronary artery disease or cancers.   PHYSICAL EXAMINATION:   VITAL SIGNS: Temperature 98.3, pulse 87, respirations 20, blood pressure 160/81, saturating 98% on room air.   GENERAL: Elderly Caucasian female patient sitting up in a chair comfortable, conversational, cooperative with exam.   PSYCHIATRIC: Alert and oriented x3. Mood and affect appropriate. Judgment intact.   HEENT:  Atraumatic, normocephalic. Oral mucosa moist and pink. External ears and nose normal. No pallor. No icterus. Pupils bilaterally equal and reactive to light.   NECK: Supple. No thyromegaly. No palpable lymph nodes. Trachea midline. No carotid bruit or JVD.   CARDIOVASCULAR: S1, S2, regular rate and rhythm with a 4/6 systolic murmur. Peripheral pulses 2+. No edema.   RESPIRATORY: Normal work of breathing. Clear to auscultation on both sides.   GI: Soft abdomen. Tenderness in the right lower quadrant and diffusely all over. No rigidity or guarding. Bowel sounds present. No hepatosplenomegaly palpable. Scars from prior surgery.   SKIN: Warm and dry. No petechiae, rash, ulcers.   MUSCULOSKELETAL: No joint swelling, redness, or effusion of the large joints. Normal muscle tone.   NEUROLOGICAL: Motor strength 5/5 in upper and lower extremities. Sensation to fine touch intact all over. Cranial nerves II through XII intact.   LABORATORY, DIAGNOSTIC, AND RADIOLOGICAL DATA: WBC 6.9, creatinine 1.5, GFR 33.   CT scan shows enteritis with adjacent inflammation. No bowel obstruction or free air.   ASSESSMENT AND PLAN:  1. Acute enteritis with right lower quadrant pain and dehydration, nausea and vomiting. The patient is afebrile at this time, white count is normal but is unable to keep any food down or antibiotic pills down. Will start her on IV Zosyn as the patient is allergic to Flagyl. Will get blood cultures. Will put the patient on a clear liquid diet at this time along with symptomatic management of her pain and nausea. Hopefully the patient can be transitioned to regular food later. If there is any worsening, the patient might need Surgery consult or a GI consult.  2.  Dehydration. Start on IV fluids.  3. Uncontrolled hypertension likely secondary to throwing up her pills. Presently home medications are unknown. Will use IV medications to bring the blood pressure down. Will restart  home medications  when known.  4. CKD stage III. Creatinine is 1.5. Needs to be monitored with the ongoing dehydration. Continue IV fluids.  5. DVT prophylaxis with Lovenox.   CODE STATUS: FULL CODE.   TIME SPENT: Time spent today on this case was 60 minutes with more than 50% time spent in coordination of care.   ____________________________ Leia Alf. Dacey Milberger, MD srs:drc D: 09/04/2012 18:18:54 ET T: 09/05/2012 06:05:28 ET JOB#: [id number]  cc: Alveta Heimlich R. Abraham Entwistle, MD, <Dictator> Neita Carp MD ELECTRONICALLY SIGNED 09/06/2012 10:40

## 2015-04-12 NOTE — Consult Note (Signed)
Brief Consult Note: Diagnosis: anemia.   Patient was seen by consultant.   Consult note dictated.   Comments: Appreciate consult for 81 y/o caucasian woman with history of recent MI (2/15), colon cancer (several years ago), IDA, and dementia, for evaluation of acute on chronic anemia. Lives at [facility]. Reports onset of increasing weakness/dizziness over the last few days. Noticed some black tarry stools for the last 2d- last episode yesterday. Reports some intermittent daily dyspepsia, but unsure if she is taking anything for this or not. States her memory is not good over the last few months. Does report some nausea but no vomiting. Rare use of Ibuprofen. Thinks she may have had EGD in past but unsure. Did follow with Dr Manual Fredonia [place] at [facility] for her colon cancer, which was resected, thinks she has had a recent cspy but unsure.Denies abdominal pain, further GI complaints. Stool blackish on rectal exam: sent for hemoccult.  Hgb has run from 5.3- 10 over the last 43m. O87 and folic acid normal. Iron studies low. Retic count 2.63, LFts ok.  Has renal insufficiency on lab review. Currently receiving PRBCs.  Noted history of MI 2/15 here at [facility], evaluated by Dr Humphrey Rolls with cardiac catheterization: looks like CABG was recommended but patient has not had. LVEF noted to be 60%, had some mild valvular disease.    Impression and plan: Acute on chronic anemia. Agree with PPI/ transfusion, however will change this to a Pantoprazole gtt. Additionally: patient was transferred to [facility]  last Feb for more definitive tx of MI, ie CABG v stent placement. Patient does not remember what was done. Daughter was contacted by nursing staff, and evidently no surgeries or other procedures were done, just medical management. This puts her at higher risk for sedated procedures..  Electronic Signatures: Stephens November H (NP)  (Signed 08-Jul-15 17:52)  Authored: Brief Consult Note   Last Updated: 08-Jul-15  17:52 by Theodore Demark (NP)

## 2015-04-12 NOTE — H&P (Signed)
PATIENT NAME:  [name], [name] MR#:  [id number] DATE OF BIRTH:  [birth year]  DATE OF ADMISSION:  05/30/2014  PRIMARY CARE PHYSICIAN:  Dr. Lamonte [name].   CHIEF COMPLAINT: Weakness.   HISTORY OF PRESENTING ILLNESS: An 81 year old female patient with history of CAD, hypertension, hypothyroidism, chronic iron deficiency anemia, and colon cancer presents to the Emergency Room complaining of weakness. Here, patient has been found to have hemoglobin low at 5.3 and is being admitted to the hospitalist service for symptomatic anemia for blood transfusion.   The patient has been seeing Dr. Oliva [name] of oncology. She was transfused 2 units of packed RBCs in January 2015 per his note.  It is unclear if she had any transfusions after that. The patient was also seen in the hospital in February 2015 with an NSTEMI, was recommended bypass, and transferred to [facility]. It is unclear what care she got there and patient is unable to tell me what happened.   The patient initially was agitated that she was here in the hospital and wanted to leave. After talking to her daughter, she has calmed down.   The patient has not had a colonoscopy in a long time. This is being scheduled by Dr. Lamonte [name] as outpatient. The patient has an appointment with her primary care physician on 06/03/2014.   PAST MEDICAL HISTORY:  1. CAD with NSTEMI in February 2015, transferred to [facility].  2. Colon cancer status post resection.  3. Chronic anemia with iron deficiency.  4. Hypothyroidism.  5. Hypertension.  6. CKD stage III.  7. Dementia.  8. GERD.  9. Depression.   ALLERGIES: FLAGYL, PHENOBARBITAL, AND DILANTIN.   SOCIAL HISTORY: The patient does not smoke. No alcohol. No illicit drugs. Lives at senior living apartments.   FAMILY HISTORY: Mother and father are deceased. Mother died from cancer, suspected skin cancer.   HOME MEDICATIONS:  1. Amlodipine 5 mg daily. 2. Celexa 10 mg daily.  3. Aricept 5  mg daily.  4. Synthroid 75 mcg daily.  5. Magnesium oxide 400 mg daily.   6. Nexium 40 mg daily.   REVIEW OF SYSTEMS:  CONSTITUTIONAL: Complains of fatigue and weakness.  EYES:  No blurry vision, pain, redness. EARS, NOSE, AND THROAT: No tinnitus, ear pain, hearing loss.   RESPIRATORY: No cough, wheeze, hemoptysis.  CARDIOVASCULAR: No chest pain, orthopnea, edema.  GASTROINTESTINAL: No nausea, vomiting, diarrhea, abdominal pain.  GENITOURINARY: No dysuria, hematuria or frequency.  ENDOCRINE: No polyuria. No nocturia, dysuria   problems.  HEMATOLOGY AND LYMPHATIC:  No anemia, easy bruising, bleeding.  INTEGUMENTARY: No acne, rash, lesion.  MUSCULOSKELETAL: No back pain, arthritis. No focal numbness, weakness, seizure.  PSYCHIATRY:  Has depression.   PHYSICAL EXAMINATION: Shows: VITAL SIGNS: Temperature 97.2, pulse 61, blood pressure 124/57, saturating 100% on room air.  GENERAL: Moderately built Caucasian female patient sitting up in a chair, pleasant.  PSYCHIATRIC: Alert, oriented to place, person but not to time. Mood and affect are appropriate.  HEENT:  Atraumatic, normocephalic.  Oral mucosa moist and pink. External ears and nose normal. Pallor positive. No icterus. Pupils are bilaterally equal and reactive to light.  NECK: Supple. No thyromegaly or palpable lymph nodes. . Trachea midline. No carotid bruit, JVD.  CARDIOVASCULAR: S1 and S2, systolic murmur. Peripheral pulses 2+. No edema.  RESPIRATORY: Normal work of breathing. Clear to auscultation on both sides.   GASTROINTESTINAL: Soft abdomen, nontender. Bowel sounds present. No hepatosplenomegaly palpable. Scars from prior surgeries.  GENITOURINARY: No significant bladder  distention.  SKIN: Warm and dry. No petechiae.  MUSCULOSKELETAL: No joint swelling, redness in large joints. Normal muscle tone.  NEUROLOGICAL: Motor strength 5/5 in upper and lower extremities. Sensation is intact all over.  LYMPHATIC:  No cervical  lymphadenopathy.   LABORATORY STUDIES: Include glucose of 90, BUN 36, creatinine 2.10, sodium 137, potassium 4.8. AST, ALT, alkaline phosphatase, bilirubin normal. Troponin less than 0.02.   WBC 5.3, hemoglobin 5.3, platelets of 283,000.   Urinalysis shows trace bacteria, but only 1 WBC.   EKG shows normal sinus rhythm, left axis deviation, nothing acute.   CT scan of the head without contrast shows mild atrophy, chronic microvascular ischemic changes, nothing acute.   Chest x-ray, PA and lateral, shows low volume chest with chronic changes.   ASSESSMENT AND PLAN:  1. Acute on chronic anemia.  Patient does have history of iron deficiency anemia, has history of colon cancer, likely has a slow chronic gastrointestinal bleed. She does not complain of any bright red blood per rectum or melena. We will check stool occult. The patient will be transfused 2 units of blood. Consent has been obtained. We will repeat hemoglobin after this. If patient does not have any frank GI bleed and if the hemoglobin comes up, patient can be discharged home with followup with the primary care physician to schedule an outpatient colonoscopy.  If the patient does have symptomatic anemia, will need transfusion.  2. Coronary artery disease, stable.  3. Hypertension. Continue home medication.  4. Chronic kidney disease stage III, stable around 2.  5. Hypothyroidism. Continue Synthroid.  6. Deep vein thrombosis prophylaxis with sequential compression devices. No Lovenox or heparin in case the patient does have frank GI bleed.   CODE STATUS: Full code.   TIME SPENT TODAY ON THIS CASE: Was 50 minutes.    ____________________________ Leia Alf Delsie Amador, MD srs:dd D: 05/30/2014 20:11:00 ET T: 05/30/2014 20:53:09 ET JOB#: [id number]  cc: Perrin Maltese, MD Athony Coppa R. Mykaylah Ballman, MD, <Dictator>    Neita Carp MD ELECTRONICALLY SIGNED 05/31/2014 13:25

## 2015-04-12 NOTE — Discharge Summary (Signed)
PATIENT NAME:  [name] [name], [name] [name] MR#:  [id number] DATE OF BIRTH:  12/20/1923  DATE OF ADMISSION:  06/26/2014 DATE OF DISCHARGE:  06/29/2014  CONSULTATION: Lollie Sails, MD, gastroenterology.   PROCEDURE: Blood transfusion of packed red cells.   IMAGING: None.   DISCHARGE DIAGNOSES:  1.  Iron deficiency anemia.  2.  Gastrointestinal bleed.   HOSPITAL COURSE: This pleasant lady was admitted to the hospital with a severely low hemoglobin. She has a history of GI bleed with recurrent anemia. She was most recently transfused several weeks ago. Following her transfusion, patient's hemoglobin rose appropriately to 8.0. At the time of discharge was 9.6. Dr. Gustavo Lah was consulted; however, she did not undergo upper or lower endoscopy before discharge. Dr. Marton Redwood plan was to further evaluate her anemia as an outpatient. The patients remaining stay in the hospital was uncomplicated. She was discharged to home in satisfactory condition.   DISCHARGE INSTRUCTIONS:    DIET: Low sodium.   ACTIVITY: As tolerated.   FOLLOWUP: With primary care physician, Lamonte Sakai, with oncologist, Dr. Oliva Bustard, and gastroenterologist, Dr. Gustavo Lah.   ____________________________ Venetia Maxon. Elijio Miles, MD sat:am D: 07/15/2014 13:28:00 ET T: 07/16/2014 01:16:34 ET  JOB#: [id number] Alfredia Ferguson A TEJAN-SIE MD ELECTRONICALLY SIGNED 07/16/2014 13:57

## 2015-04-12 NOTE — H&P (Signed)
PATIENT NAME:  [name] [name], [name] [name] MR#:  [id number] DATE OF BIRTH:  1923-11-24  DATE OF ADMISSION:  02/04/2014  PRIMARY CARE PHYSICIAN: Perrin Maltese, MD  CHIEF COMPLAINT: Chest pain.   HISTORY OF PRESENT ILLNESS: This is an 81 year old female who presented to the Emergency Room today due to chest pain that began earlier today. The patient has underlying dementia; therefore, the history is sort of poor. Therefore, will obtain mostly from the chart and the ER physician. The patient says that she was in her usual state of health and she was not exerting herself when she developed chest pain earlier today. Her daughter called EMS and the patient was noted to be in SVT, heart rates in the 140s to 150s. She presented to the Emergency Room and was given a dose of adenosine. Shortly after she received adenosine, the patient became hypotensive and developed a bradycardic rhythm. Her heart rate and blood pressure has improved since she has gotten some IV fluids here in the Emergency Room. She is currently chest pain-free and hemodynamically stable although hospitalist services were contacted for further treatment and evaluation.   REVIEW OF SYSTEMS: CONSTITUTIONAL: No documented fever. No weight gain or weight loss.  EYES: No blurred or double vision.  ENT: No tinnitus. No postnasal drip. No redness of the oropharynx.  RESPIRATORY: No cough, no wheeze, no hemoptysis, no dyspnea.  CARDIOVASCULAR: Positive chest pain. No orthopnea. Positive palpitations. No syncope.  GASTROINTESTINAL: No nausea, vomiting, diarrhea. No abdominal pain. No melena or hematochezia.  GENITOURINARY: No dysuria or hematuria.  ENDOCRINE: No polyuria or nocturia, heat or cold intolerance. HEMATOLOGY:  No anemia. No bruising. No bleeding.  INTEGUMENTARY: No rashes or lesions.  MUSCULOSKELETAL: No arthritis. No swelling. No gout.  NEUROLOGIC: No numbness or tingling. No ataxia. No seizure activity.  PSYCHIATRIC: No anxiety, no insomnia. No  ADD. Positive depression.   PAST MEDICAL HISTORY: Consistent with hypertension, dementia, hypothyroidism, GERD and depression.   ALLERGIES: ALLERGIC TO FLAGYL, PHENOBARBITAL AND RANITIDINE.   SOCIAL HISTORY: No smoking. No alcohol abuse. No illicit drug abuse. Lives at home with her daughter.   FAMILY HISTORY: Both mother and father are deceased. Mother died from cancer, suspected to be a skin cancer. Father had history of heavy alcohol abuse.   CURRENT MEDICATIONS: Amlodipine 5 mg daily, Celexa 10 mg daily, Aricept 5 mg at bedtime, Synthroid 75 mcg daily, magnesium oxide 400 mg daily, Nexium 40 mg daily.   PHYSICAL EXAMINATION: Presently is as follows:  VITAL SIGNS:  Temperature 98.2, pulse 76, respirations 20, blood pressure 156/80, sats 100% on room air.  GENERAL: She is a pleasant-appearing female but in no apparent distress.  HEAD, EYES, EARS, NOSE AND THROAT: Atraumatic, normocephalic. Extraocular muscles are intact. Pupils are equal and reactive to light. Sclerae anicteric. No conjunctival injection. No pharyngeal erythema.  NECK: Supple. There is no jugular venous distention. No bruits, no lymphadenopathy, no thyromegaly.  HEART: Regular rate and rhythm. There is a 2 out of 6 systolic ejection murmur heard at the right sternal border. No rubs, no clicks.  LUNGS: Clear to auscultation bilaterally. No rales, no rhonchi, no wheezes.  ABDOMEN: Soft, flat, nontender, nondistended. Has good bowel sounds. No hepatosplenomegaly appreciated.  EXTREMITIES: No evidence of any cyanosis, clubbing or peripheral edema. Has +2 pedal and radial pulses bilaterally.  NEUROLOGICAL: The patient is alert, awake and oriented x 3 with no focal motor or sensory deficits appreciated bilaterally.  SKIN: Moist and warm with no rashes appreciated.  LYMPHATIC: There is  no cervical or axillary lymphadenopathy.   LABORATORY DATA: Serum glucose of 90, BUN 31, creatinine 1.8, sodium 140, potassium 3.6, chloride 109,  bicarb 23. Troponin less than 0.02. TSH 2.3. White cell count 7.8, hemoglobin 10, hematocrit 31.4, platelet count 254. INR 0.9.   ASSESSMENT AND PLAN: This is an 81 year old female with history of hypertension, dementia, depression, hypothyroidism, gastroesophageal reflux disease, presented to the hospital with chest pain and noted to be in supraventricular tachycardia. The patient got 1 dose of adenosine and became acutely hypotensive and bradycardic, responded well to IV fluids.  1.  Supraventricular tachycardia. The exact etiology of the underlying supraventricular tachycardia is unclear. There was no underlying atrial fibrillation or flutter. The patient converted to a normal sinus rhythm shortly after she received adenosine. For now, I will observe her overnight on telemetry, watch for any further arrhythmias. The patient's potassium is normal. I will go ahead and check a magnesium level.  2.  Chest pain. I suspect this is probably related to the supraventricular tachycardia and it has since then resolved. She is currently chest pain-free and hemodynamically stable. I will observe her overnight on telemetry, follow serial cardiac markers. Continue aspirin, get a 2-dimensional echocardiogram. Will also consult cardiology. The patient is known to Dr. Neoma Laming.   3.  Dementia. Continue with her Aricept.  4.  Hypothyroidism. Continue Synthroid.  5.  Gastroesophageal reflux disease. Continue Protonix.  6.  Hypertension. Continue Norvasc.  7.  CODE STATUS: The patient is a full code.   TIME SPENT ON ADMISSION: 50 minutes.   ____________________________ Belia Heman. Verdell Carmine, MD vjs:cs D: 02/04/2014 19:48:05 ET T: 02/04/2014 20:05:09 ET JOB#: [id number]  cc: Belia Heman. Verdell Carmine, MD, <Dictator> Henreitta Leber MD ELECTRONICALLY SIGNED 02/15/2014 18:03

## 2015-04-12 NOTE — Consult Note (Signed)
Chief Complaint:  Subjective/Chief Complaint seen for melena, acute blood loss anemia.  stable hgb, denies abdominal pain or nausea, tolerating po.   VITAL SIGNS/ANCILLARY NOTES: **Vital Signs.:   10-Jul-15 13:44  Vital Signs Type Routine  Temperature Temperature (F) 97.6  Celsius 36.4  Pulse Pulse 63  Respirations Respirations 20  Systolic BP Systolic BP 035  Diastolic BP (mmHg) Diastolic BP (mmHg) 81  Mean BP 120  Pulse Ox % Pulse Ox % 97  Pulse Ox Activity Level  At rest  Oxygen Delivery Room Air/ 21 %    15:54  Vital Signs Type Recheck  Systolic BP Systolic BP 465  Diastolic BP (mmHg) Diastolic BP (mmHg) 66  Mean BP 93  *Intake and Output.:   10-Jul-15 14:42  Stool  medium formed dark black stool per patient.   Brief Assessment:  Cardiac Irregular   Respiratory clear BS   Gastrointestinal details normal Soft  Nontender  Nondistended  Bowel sounds normal   Additional Physical Exam DRE-dark green soft formed stool in the rectal vault   Lab Results: Routine Chem:  08-Jul-15 19:29   BUN  37  09-Jul-15 04:47   BUN  29  10-Jul-15 04:44   Glucose, Serum 89  BUN  26  Creatinine (comp)  1.77  Sodium, Serum 139  Potassium, Serum 4.5  Chloride, Serum  110  CO2, Serum 22  Calcium (Total), Serum  8.3  Anion Gap 7  Osmolality (calc) 282  eGFR (African American)  29  eGFR (Non-African American)  25 (eGFR values <78m/min/1.73 m2 may be an indication of chronic kidney disease (CKD). Calculated eGFR is useful in patients with stable renal function. The eGFR calculation will not be reliable in acutely ill patients when serum creatinine is changing rapidly. It is not useful in  patients on dialysis. The eGFR calculation may not be applicable to patients at the low and high extremes of body sizes, pregnant women, and vegetarians.)  Routine Hem:  09-Jul-15 04:47   Hemoglobin (CBC)  8.0  10-Jul-15 04:44   WBC (CBC) 6.1  RBC (CBC)  2.71  Hemoglobin (CBC)  8.2   Hematocrit (CBC)  23.7  Platelet Count (CBC) 185  MCV 88  MCH 30.4  MCHC 34.7  RDW  16.9  Neutrophil % 64.6  Lymphocyte % 17.8  Monocyte % 12.2  Eosinophil % 4.8  Basophil % 0.6  Neutrophil # 4.0  Lymphocyte # 1.1  Monocyte # 0.8  Eosinophil # 0.3  Basophil # 0.0 (Result(s) reported on 28 Jun 2014 at 05:39AM.)   Assessment/Plan:  Assessment/Plan:  Assessment 1) melena- some variable stool dark brown to dark green, forming. heme positive on rectal exam.  hemodynamically stable, on iv ppi, stable hemoglobin.  2) severe cardiac disease, poor EF.  relative high risk for sedated proceedure.   Plan 1) continue iv ppi for now.   no asa or nsaids.  daily hgb, transfuse as needed.  If there is recurrent drop of hgb, may need to do egd, but will be high risk for cardiac complications. soft diet.   Electronic Signatures: SLoistine Simas(MD)  (Signed 10-Jul-15 16:31)  Authored: Chief Complaint, VITAL SIGNS/ANCILLARY NOTES, Brief Assessment, Lab Results, Assessment/Plan   Last Updated: 10-Jul-15 16:31 by SLoistine Simas(MD)

## 2015-04-12 NOTE — Consult Note (Signed)
Chief Complaint:  Subjective/Chief Complaint Please see full GI consult and brief consult note.  Patient seen adn examined.  Patietn admitted with acute on chronic anemia and h/o iron deficiency anemia.  Remote h/o colon cancer and right hemicolectomy.  No records of egd or colonoscopy at [facility],  Recent NSTEMI (2/15) and subsequent transfer to [facility] for definitave management, however no intervention was elected despite significant CAD in several vessels, apparently option for medical management.  Patietn with recent black stools, no hematemesis, undergoing tfx at present, now on ppi iv. Probable remote PUDz.  Recommend serial hgb, transfuse as needed, continue iv ppi.  Patient is very high risk for sedated proceedure due to CAD essentially unchanged from her MI several months ago, likely with possible progression.  Will discuss with Dr Oliva Bustard tomorrow.   VITAL SIGNS/ANCILLARY NOTES: **Vital Signs.:   08-Jul-15 14:51  Temperature Temperature (F) 98.2  Celsius 36.7  Pulse Pulse 64  Respirations Respirations 16  Systolic BP Systolic BP 931  Diastolic BP (mmHg) Diastolic BP (mmHg) 66  Mean BP 93  Pulse Ox % Pulse Ox % 96  Oxygen Delivery Room Air/ 21 %   Electronic Signatures: Loistine Simas (MD)  (Signed 08-Jul-15 18:58)  Authored: Chief Complaint, VITAL SIGNS/ANCILLARY NOTES   Last Updated: 08-Jul-15 18:58 by Loistine Simas (MD)

## 2015-04-12 NOTE — Consult Note (Signed)
PATIENT NAME:  GENEAN, ADAMSKI MR#:  [id number] DATE OF BIRTH:  07-06-1923  DATE OF CONSULTATION:  06/26/2014  REFERRING PHYSICIAN:   CONSULTING PHYSICIAN:  Dr. Oliva Bustard of oncology.   REASON FOR CONSULTATION: CAD, hypertension, anemia, and black stools.   HISTORY OF PRESENTING ILLNESS: An 81 year old Caucasian female patient with history of CAD, colon cancer status post resection, chronic iron deficiency anemia, hypertension, presents to the hospital as a direct admit from the cancer center admitted by Dr. Oliva Bustard for black stools, anemia. The patient mentions that she had an episode of black stool yesterday. She has received recurrent blood transfusions, but her hemoglobin continues to drop. This was checked earlier today and showed a hemoglobin level of 5.3. The patient was recently admitted to the hospital, but at discharge had hemoglobin up to 9.4. She mentions that she had lightheadedness, dizziness, also has fatigue, some shortness of breath, but no chest pain. Has not had any frank bleeding, but mentions 1 episode of black stool yesterday, which was tarry. She feels like she had a colonoscopy done recently, although does not remember. I  have discussed with Dr. Oliva Bustard who is not aware of a recent colonoscopy patient had.   The patient does not use any nonsteroidal anti-inflammatory drugs  No blood thinners. Not on any chronic steroids.  PAST MEDICAL HISTORY:  1. CAD with NSTEMI in February 2015, transferred to [facility].  2. Colon cancer status post resection.  3. Chronic anemia with iron deficiency.  4. Hypothyroidism.  5. Hypertension.  6. CKD stage III. 7. Dementia.  8. GERD.  9. Depression.   ALLERGIES: FLAGYL, PHENOBARBITAL, AND DILANTIN.   SOCIAL HISTORY: The patient does not smoke. No alcohol. No illicit drugs. Lives at independent apartments.   FAMILY HISTORY: Mother and father deceased. Mother died from cancer, possibly skin cancer.   HOME MEDICATIONS:  1. Amlodipine  5 mg daily.  2. Celexa 10 mg daily.  3. Aricept 5 mg daily.  4. Synthroid 75 mcg daily.  5. Magnesium oxide 400  mg daily.  6. Nexium 40 mg daily.  REVIEW OF SYSTEMS:  CONSTITUTIONAL: Complains of fatigue, weakness, weight loss.  EYES:  No blurry vision, pain, redness.  EARS, NOSE, AND THROAT:  No tinnitus, ear pain, hearing loss.  RESPIRATORY: No cough, wheeze, hemoptysis. CARDIOVASCULAR: No chest pain, orthopnea, edema.  GASTROINTESTINAL: No nausea, vomiting, diarrhea, abdominal pain.  GENITOURINARY: No dysuria, hematuria, or frequency.  ENDOCRINE: No polyuria, nocturia, thyroid problems. HEMATOLOGY AND LYMPHATIC:  No anemia, easy bruising. Does have melena.  INTEGUMENTARY:   No acne, rash, lesion.  MUSCULOSKELETAL: No back pain, arthritis.  PSYCHIATRIC: Has depression.   PHYSICAL EXAMINATION:  VITAL SIGNS:  Shows temperature 98.2, pulse 76, blood pressure 110/62, saturating 100% on room air.  GENERAL: Moderately built Caucasian female patient sitting up in her bed, seems pleasant.  PSYCHIATRIC: Alert and oriented to place and person. Pleasant.  HEENT: Atraumatic, normocephalic .  Mucosa moist and pink. External ears and nose normal. Pallor positive. No icterus. Pupils bilaterally equal and react to light.  NECK: Supple. No thyromegaly or palpable lymph nodes. Trachea midline. No carotid bruit or JVD .  CARDIOVASCULAR: S1, S2 with a systolic murmur. Peripheral pulses 2+. No edema.  RESPIRATORY: Normal work of breathing.  Clear to auscultation on both sides. GASTROINTESTINAL: Soft abdomen, nontender. Bowel sounds present. No hepatosplenomegaly palpable.  Scars from prior surgeries found.  GENITOURINARY: No CVA tenderness or bladder distention.  SKIN: Warm and dry. No petechiae, rash, ulcers.  MUSCULOSKELETAL: No joint swelling or redness in large joints. Normal muscle tone.  NEUROLOGICAL: Motor strength 5/5 in upper and lower extremities. Sensation to fine touch intact all over.   LYMPHATIC: No cervical lymphadenopathy.   LABORATORY STUDIES:  Show WBC 6.3, hemoglobin 5.3, platelets of 272,000 with MCV of 86.   ASSESSMENT AND PLAN:  1. Acutely worsening chronic iron deficiency anemia in a patient with history of colon cancer. Possibly has chronic blood loss, but at this time with melena a concern for acute blood loss. Will consult gastroenterology. Check stool for Hemoccult. The patient will be transfused packed RBCs. Discussed with Dr. Oliva Bustard of hematology and oncology. No heparin, Lovenox, or aspirin. The patient will be started on Protonix. Monitor closely.  2. Coronary artery disease, stable.  3. Hypertension. Continue home medication.  4. Chronic kidney disease stage III, stable.  5. Hypothyroidism. We will continue Synthroid.  6. Deep vein thrombosis prophylaxis with sequential compression devices. No Lovenox or heparin.   CODE STATUS: Full code.   TIME SPENT TODAY ON THIS CASE:  45 minutes.   ____________________________ Leia Alf Halaina Vanduzer, MD srs:dd D: 06/26/2014 14:11:11 ET T: 06/27/2014 03:06:23 ET JOB#: [id number]  cc: Alveta Heimlich R. Darvin Neighbours, MD, <Dictator> Perrin Maltese, MD Martie Lee. Oliva Bustard, MD  Neita Carp MD ELECTRONICALLY SIGNED 07/02/2014 18:17

## 2015-04-12 NOTE — Consult Note (Signed)
PATIENT NAME:  [name] [name], [name] [name] MR#:  [id number] DATE OF BIRTH:  1923-04-13  DATE OF CONSULTATION:  02/05/2014  CARDIOLOGY CONSULT  CONSULTING PHYSICIAN:  Dionisio David, MD  INDICATION FOR CONSULTATION: Chest pain and non-STEMI.   HISTORY OF PRESENT ILLNESS: This is an 81 year old white female with a past medical history of hypertension, hypothyroidism, depression, GERD, who came into the Emergency Room with chest pain and was found to be in supraventricular tachycardia at rate 140. She was given adenosine and she converted to sinus bradycardia. She at that time was having severe episode of chest pain and shortness of breath. Right now she still feels some heaviness in the chest, but is much better than before.   PAST MEDICAL HISTORY: It says that she has history of mild dementia, but she appears to be quite alert and oriented and was able to dial the phone number for me for her daughter.   ALLERGIES: FLAGYL, PHENOBARBITAL AND RANITIDINE.   SOCIAL HISTORY: She denies EtOH abuse or smoking.   FAMILY HISTORY: Positive for coronary artery disease.   MEDICATIONS: Amlodipine, Celexa, Aricept, Synthroid and Nexium.   PHYSICAL EXAMINATION: GENERAL: She is alert and oriented x 3, in no acute distress right now.  VITAL SIGNS: Stable.  NECK: No JVD.  LUNGS: Clear.  HEART: Regular rate and rhythm. Normal S1, S2. No audible murmur.  ABDOMEN: Soft, nontender, positive bowel sounds.  EXTREMITIES: No pedal edema.  NEUROLOGIC: She appears to be intact.   Her EKG shows sinus bradycardia, 46 beats per minute, left axis deviation, and nonspecific ST-T changes. When she first came in she was found to be in supraventricular tachycardia. She was given adenosine and she then converted to sinus bradycardia; however, she had some ST depression inferolaterally suggestive of severe ischemia. Her troponins came back high. Her initial troponin was negative, the follow-up troponin; however, has come back 13 and the  third set is 24. Her creatinine is also elevated. BUN 31, creatinine 1.82 and her hemoglobin is 10.0.   ASSESSMENT AND PLAN: The patient had supraventricular tachycardia associated with chest pain and initial troponin was negative, but second and third troponin has become significantly elevated from 13 to 24. She still has some heaviness in the chest. Question was does she want everything done and this was discussed with her. Apparently, it is noted in the chart that she has likely some mild dementia, but she appears to be quite alert and oriented, wants everything done and she wants to be treated for whatever is needed as discussed, and option of cardiac catheterization was given to her and she wants everything done be set up for it. So, we will be setting up for cardiac catheterization.  Thank you very much for the referral.  ____________________________ Dionisio David, MD sak:sg D: 02/05/2014 11:21:00 ET T: 02/05/2014 11:40:40 ET JOB#: [id number]  cc: Dionisio David, MD,T>>    Dionisio David MD ELECTRONICALLY SIGNED 03/04/2014 12:09

## 2015-04-12 NOTE — Consult Note (Signed)
PATIENT NAME:  [name] [name], [name] [name] MR#:  [id number] DATE OF BIRTH:  09/03/1923  DATE OF CONSULTATION:  06/26/2014  REFERRING PHYSICIAN:  Dr. Oliva Bustard. CONSULTING PHYSICIAN:  Gladstone Lighter, MD  PRIMARY CARE PHYSICIAN: Dr. Lamonte Sakai.   REASON FOR CONSULTATION: Medical management.   BRIEF HISTORY: [name] is an  81 year old elderly Caucasian female with past medical history significant for anemia of chronic disease requiring blood transfusions in the past, history of coronary artery disease status post recent NSTEMI in February 2015, colon cancer status post hemicolectomy at [facility], CKD stage III, hypothyroidism,  hypertension, dementia, was from assisted living facility, was sent in from Dr. Metro Kung office for direct admit due to symptomatic anemia.  Patient has been following up with Dr. Oliva Bustard for anemia, and further work-up has revealed probably iron deficiency anemia. The patient has not had any recent gastrointestinal work-up, though her last colonoscopies were after follow-up from her colon cancer and the records are not available at this time.  The patient is known to have a hemoglobin of 5.3 on June 2015, that improved after transfusion, was discharged home at the time. Sees Dr. Oliva Bustard in the office and repeat blood work shows hemoglobin again 5.3 today and is being admitted for transfusion and also further work-up for her anemia at this time. Denies any active bleeding, but noticed  black stools over the last 2 days. She has been feeling weak, lightheaded and dizzy for almost 1-2 weeks now.  Denies any recent travel.  Not on any anticoagulation medications other than taking just 81 mg of aspirin for her heart ever day. Has not had a colonoscopy in a long time at this time.   PAST MEDICAL HISTORY:  1.  Coronary artery disease with NSTEMI February 2015.  2.  Colon cancer, status post hemicolectomy at [facility].  3.  Acute on chronic anemia due to iron deficiency, status post blood transfusions in the  past.  4.  Hypothyroidism. 5.  Hypertension.  6.  CKD stage III.  7.  Dementia.  8.  Depression.  9.  Gastroesophageal reflux disease.  10.  Osteoarthritis.   PAST SURGICAL HISTORY:  1.  Hemicolectomy.  2.  Appendectomy.   ALLERGIES TO MEDICATIONS: PHENOBARBITAL, DILANTIN, FLAGYL.   CURRENT HOME MEDICATIONS: 1.  Nexium 40 mg p.o. daily.  2.  Amlodipine 5 mg p.o. daily.  3.  Levothyroxine 75 mcg p.o. daily.  4.  Donepezil 5 mg p.o. at bedtime.  5.  Magnesium oxide 400 mg p.o. daily.  6.  Celexa 10 mg p.o. daily.  7.  Ocuvite multivitamin 1 tablet p.o. daily.  8.  Tylenol 500 mg p.o. q. 8 hours p.r.n.  9.  Fluoxetine 20 mg p.o. daily.  10.  Enalapril 2.5 mg p.o. daily.  11.  Atorvastatin 80 mg p.o. daily.  12.  Aspirin 81 mg p.o. daily.   SOCIAL HISTORY: Lives at assisted living facility.  Has two daughters, one of them lives in [place], [place], and the other lives at [place].  No history of any smoking or alcohol use.   FAMILY HISTORY: Significant for cancer in her mother.  Patient does not remember what kind of cancer she has.   REVIEW OF SYSTEMS:  CONSTITUTIONAL: Positive for fatigue and weakness. No fever.  EYES: No blurred vision, double vision. Has a history of cataracts and also uses reading glasses. No inflammation.  EAR, NOSE, AND THROAT: No tinnitus, ear pain, hearing loss, epistaxis or discharge. RESPIRATORY:  No cough, wheeze, hemoptysis, or COPD.  CARDIOVASCULAR:  No chest pain, orthopnea, edema, arrhythmia, palpitations, or syncope.  GASTROINTESTINAL: No nausea, vomiting, or abdominal pain. Positive for melena.  No rectal bleed.  GENITOURINARY:  No dysuria, hematochezia, renal calculus or frequency.  ENDOCRINE: No polyuria, nocturia, thyroid problems, heat or cold intolerance.  HEMATOLOGY: Positive for anemia.  No easy bruising or bleeding.  SKIN: No acne, rash or lesions.  MUSCULOSKELETAL: Positive for arthritis and chronic low back pain. No gout.   NEUROLOGIC: No history of CVA, TIAs or seizures.  PSYCHOLOGICAL: No anxiety, insomnia or depression.    PHYSICAL EXAMINATION:  VITAL SIGNS: Temperature 98.2 degrees Fahrenheit, pulse 64, respirations 16, blood pressure 149/66, pulse oximetry 96% on room air.  GENERAL: Elderly patient, well built, well nourished, sitting in bed, not in any acute distress.  HEENT: Normocephalic, atraumatic. Pupils equal, round, reacting to light. Anicteric sclerae. Extraocular movements intact. Oropharynx clear without erythema, mass or exudates.  NECK: Supple. No thyromegaly, JVD or carotid bruits. No lymphadenopathy.  LUNGS: Clear to auscultation bilaterally. No wheeze.  Positive for fine bibasilar crackles, worse on the left side. No use of accessory muscles for breathing.  CARDIOVASCULAR: S1, S2, regular rate and rhythm. Loud 3/6 systolic murmur heard, especially in the mitral area.  No rubs or gallops.  ABDOMEN: Soft, nontender, nondistended. No hepatosplenomegaly. Normal bowel sounds.  EXTREMITIES: No pedal edema. No clubbing or cyanosis. 3+ dorsalis pedis pulses palpable bilaterally.  SKIN: No acne, rash or lesions.  LYMPHATICS: No cervical lymphadenopathy.  NEUROLOGIC: Cranial nerves intact. No focal motor or sensory deficits. PSYCHOLOGIC:  Patient is awake.  Alert, but oriented to self at this time.  LABORATORY DATA: WBC 6.3, hemoglobin 5.3, hematocrit 16.6, platelet count is 272,000. Her basic metabolic panel is pending at this time.   RECOMMENDATION: This is an 81 year old female with history of coronary artery disease, chronic kidney disease, colon cancer in the past, hypertension, chronic anemia, and dementia, who was admitted for symptomatic anemia and medical consult has been requested for medical management.   1.  Acute on chronic anemia. The patient admitted for blood transfusion. Further management per hematology.  Gastroenterology has also been consulted as there is the concern that this could  have been gastrointestinal source due to her iron deficiency.  Stool for occult blood ordered due to patient's melena history.  I am not sure if she had further follow-up colonoscopies after her colon cancer resection or any CT scan. The records from [facility] might be helpful.  Change her Protonix to IV b.i.d. at this time, as this could still be upper GI cause. Hemoglobin check after the 2 units of transfusion and further transfusion if needed especially with her history of coronary artery disease. Currently, no active bleeding and vitals are stable.   2.  Coronary artery disease with recent myocardial infarction.  Aspirin held. But note that pt had a NSTEMI in Feb 2015- so start asa sooner than later once stable.  Patient is asymptomatic without any chest pain or any transfusion to bring the hemoglobin up. Hold blood pressure medications with her anemia and gastrointestinal bleed and possible hypertension.  Continue statin.   3.  Chronic kidney disease, stage III.  Basic metabolic panel is pending. Baseline creatinine seems to be around 2.   4.  Dementia appears to be at baseline. Continue Donepezil.   5.  Hypothyroidism on Synthroid.   6.  Depression. Continue her home medications. We will follow along.   7.  CODE STATUS: FULL CODE.  TIME SPENT ON CONSULTATION: 50 minutes.   ____________________________ Gladstone Lighter, MD rk:ts D: 06/26/2014 16:50:02 ET T: 06/26/2014 19:51:11 ET JOB#: [id number]  cc: Gladstone Lighter, MD, <Dictator> Janak K. Oliva Bustard, MD Perrin Maltese, MD Gladstone Lighter MD ELECTRONICALLY SIGNED 06/27/2014 10:53

## 2015-04-12 NOTE — Consult Note (Signed)
PATIENT NAME:  [name] [name], [name] [name] MR#:  [id number] DATE OF BIRTH:  1923-10-23  DATE OF CONSULTATION:  06/26/2014  REFERRING PHYSICIAN:   CONSULTING PHYSICIAN:  Theodore Demark, NP  REASON FOR CONSULTATION: GI consult ordered by Dr. Oliva Bustard who has admitted her for acute on chronic anemia exacerbation with history of iron deficiency anemia.   REASON FOR CONSULTATION:  I appreciate consult for 81 year old Caucasian woman with history of recent MI 01/2014, colon cancer several years ago,  IDA, and dementia for evaluation of acute on chronic anemia. Lives at [facility]. Reports onset of increasing weakness, dizziness over the last few days.  Noticed some black tarry stools for the last 2 days. Last episode yesterday. Reports some intermittent daily dyspepsia but unsure if she is taking anything for this or not. States her memory is not good over the last few months.  Does report some nausea but no vomiting. Rare use of ibuprofen. Thinks she may have had EGD in the past but unsure. Did follow with Dr. Blaine Hamper at [facility] for her colon cancer which was resected. Thinks she has had recent colonoscopy but unsure.  Denies abdominal pain and further GI complaints.  Hemoglobin has run from 5.3 to 10 over the last 6 months. Had a blood transfusion last month. T26 and folic acid, normal. Iron studies low.  Reticulocyte count 2.63. LFTs okay. Has renal insufficiency on lab review.  Currently receiving packed red blood cells. Regarding her history of myocardial infarction, 01/2014, she was initially treated here at [facility], evaluated by Dr. Chancy Milroy with cardiac catheterization. She was found to have 100% blockage in the RCA with good collaterals. She had a 90% proximal LAD occlusion, and a subsequent other lesion in the LAD of 80%.  Did have echocardiogram with normal LVEF and mild valvular disease at that time. She was transferred to [facility] for further treatment, i.e. stent placement or CABG.  Patient unable to remember what happened while she was there. Nursing staff contacted daughter who reports that no treatment was done at that time other than medical management.   FAMILY HISTORY: No family history of colorectal cancer, breast cancer, ovarian cancer.   SOCIAL HISTORY: Lives at [place] [facility] assisted living. No alcohol, tobacco, or illicits.   PAST MEDICAL AND SURGICAL HISTORY:  Myocardial infarction, colon cancer, underwent right hemicolectomy at [facility]. She is unable to remember when this was.   ALLERGIES: FLAGYL, PHENOBARBITAL, RANITIDINE.   HOME MEDICATIONS: Nexium 40 mg p.o. daily, amlodipine 5 mg p.o. daily, levothyroxine 75 mcg p.o. daily, donezepil 5 mg daily, Mag-Ox 400 mg daily, citalopram 10 mg p.o. daily, Ocuvite vitamin daily, Tylenol 500 mg q. 8 h. p.r.n., fluoxetine 20 mg p.o. daily, enalapril 0.5 mg p.o. daily, atorvastatin 80 mg p.o. daily, aspirin 81 mg p.o. daily.   REVIEW OF SYSTEMS:  Ten systems reviewed, unremarkable other than what is noted above. Agree with admitting H and P.   LABORATORY DATA:  Most recent labs: WBC 6.3, hemoglobin 5.3, hematocrit 16.6, platelet count 272,000.  Red cells appear normocytic with increased RDW.  Last month, her BUN and creatinine were elevated at 36 and 2.1 with a, GFR of 20, potassium was normal. Iron was low at 23. Iron saturation 6%. Total protein 6.6, albumin 3.0, total bilirubin 0.2, ALP 61, AST 21, ALT 14.  No imaging studies this visit.   PHYSICAL EXAMINATION:  VITAL SIGNS: Most recent vital signs: Temperature 98.1, pulse 64, respiratory rate 16, blood pressure 149/66, SAO2  of 96%.  GENERAL: Pale, pleasant, well-appearing woman in no acute distress.  HEENT: Normocephalic, atraumatic. Conjunctivae pale.  NECK: Supple. No thyromegaly, JVD.  CARDIAC: S1, S2 noted, grade 3/6 systolic murmur to all auscultation points. No gallop. No appreciable edema.  CHEST: Respirations eupneic. Lungs clear.  ABDOMEN: Bowel sounds  x 4, nondistended, nontender, soft. No guarding, rigidity, peritoneal signs, hepatosplenomegaly, masses or other abnormalities.  SKIN: Warm, dry, pale, pink. No erythema, lesion or rash.  EXTREMITIES: MAEW x 4. Some generalized weakness.  RECTAL: External hemorrhoids, nonirritated-appearing. Stool blackish.  Nontender.  NEUROLOGICAL: Alert, oriented x 3. Cranial nerves intact. Speech clear. No facial droop.  PSYCHIATRIC: Pleasant, forgetful with recent things. Better long-term memory, cooperative.   IMPRESSION AND PLAN: Acute on chronic anemia. Agree with proton pump inhibitor/transfusions; however, we will change her PPI to a pantoprazole drip. Patient is at high risk for sedated procedures due to her untreated coronary artery disease. Further recommendations to follow.   These services were provided by Stephens November, MSN, [facility], in collaboration with Diannia Ruder, M.D. with whom I have discussed this patient in full.   Thank you very much for this consult.    ____________________________ Theodore Demark, NP chl:dd D: 06/26/2014 18:02:35 ET T: 06/26/2014 18:27:23 ET JOB#: [id number]  cc: Theodore Demark, NP, <Dictator> [place] SIGNED 07/08/2014 11:37

## 2015-04-12 NOTE — Discharge Summary (Signed)
PATIENT NAME:  [name] [name], [name] [name] MR#:  [id number] DATE OF BIRTH:  [birth year]  DATE OF ADMISSION:  02/06/2014 DATE OF DISCHARGE:  02/06/2014  ADMITTING PHYSICIAN:  Belia Heman. Verdell Carmine, MD   DISCHARGE PHYSICIAN:  Venetia Maxon. Tejan-Sie, MD  DISCHARGE DIAGNOSIS:  Non-ST elevation myocardial infarction.   PROCEDURE: Left heart catheterization.   CONSULTATIONS: Cardiology, Dr. Gailen Shelter COURSE: This lady was admitted through the Emergency Room where she presented with troponin elevation consistent with a non-ST elevation myocardial infarction. After experiencing chest pain at home. Please refer to the history and physical for details. The patient underwent a left heart catheterization on 02/05/2014, which revealed severe three-vessel disease, occluded RCA with good collaterals and high grade bifurcation of proximal LAD. The patient was placed on heparin, negative chronotropic agents, Plavix and other antiplatelet agents. Cardiology recommended that she undergo a coronary artery bypass graft and that she be transferred to [facility]. The patient agreed to be transferred. She was discharged to that facility on 02/06/2014.   MEDICATIONS: The patient is to resume: 1.  Nexium 40 mg daily. 2.  Amlodipine 5 mg daily. 3.  Levothyroxine 5 mcg daily. 4.  Donepezil 5 mg at bedtime. 5.  Magnesium oxide 400 mg daily.  6.  Citalopram 10 mg once a day.   DIET: Low-sodium, low-fat, low-cholesterol.   ACTIVITY: No exertional activity.  FOLLOWUP:  Follow up 1 to 2 weeks after discharge from [facility].   DISCHARGE PROCESS TIME SPENT: 35 minutes  ____________________________ Venetia Maxon. Elijio Miles, MD sat:sw D: 02/23/2014 11:27:00 ET T: 02/23/2014 23:02:58 ET JOB#: [id number]  cc: Sheikh A. Elijio Miles, MD, <Dictator> Veverly Fells MD ELECTRONICALLY SIGNED 02/25/2014 13:32

## 2015-04-12 NOTE — Consult Note (Signed)
Chief Complaint:  Subjective/Chief Complaint seen for black stool/anemia.  H/O recent NSTEMI and severe CAD.  hemodynamically stable, no furter bleeding s/p tfx. 3 bm recorded since yesterday, loose, not report of color, etc. Patient denies abdominalpain or nausea.  tolerating po.   VITAL SIGNS/ANCILLARY NOTES: **Vital Signs.:   09-Jul-15 14:11  Vital Signs Type Blood Transfusion Complete  Temperature Temperature (F) 98.2  Celsius 36.7  Temperature Source oral  Pulse Pulse 58  Respirations Respirations 20  Systolic BP Systolic BP 546  Diastolic BP (mmHg) Diastolic BP (mmHg) 70  Mean BP 92  Pulse Ox % Pulse Ox % 99  Oxygen Delivery Room Air/ 21 %   Brief Assessment:  Cardiac Irregular   Respiratory clear BS   Gastrointestinal details normal Soft  Nontender  Nondistended  No masses palpable  Bowel sounds normal   Lab Results: Thyroid:  09-Jul-15 04:47   Thyroid Stimulating Hormone 2.39 (0.45-4.50 (International Unit)  ----------------------- Pregnant patients have  different reference  ranges for TSH:  - - - - - - - - - -  Pregnant, first trimetser:  0.36 - 2.50 uIU/mL)  Routine Chem:  08-Jul-15 19:29   BUN  37  09-Jul-15 04:47   Glucose, Serum 95  BUN  29  Creatinine (comp)  1.77  Sodium, Serum 138  Potassium, Serum 4.7  Chloride, Serum  108  CO2, Serum 24  Calcium (Total), Serum  8.4  Anion Gap  6  Osmolality (calc) 281  eGFR (African American)  29  eGFR (Non-African American)  25 (eGFR values <25m/min/1.73 m2 may be an indication of chronic kidney disease (CKD). Calculated eGFR is useful in patients with stable renal function. The eGFR calculation will not be reliable in acutely ill patients when serum creatinine is changing rapidly. It is not useful in  patients on dialysis. The eGFR calculation may not be applicable to patients at the low and high extremes of body sizes, pregnant women, and vegetarians.)  Routine Hem:  09-Jul-15 04:47   WBC (CBC)  6.0  RBC (CBC)  2.69  Hemoglobin (CBC)  8.0  Hematocrit (CBC)  23.4  Platelet Count (CBC) 213  MCV 87  MCH 29.7  MCHC 34.1  RDW  17.8  Neutrophil % 67.6  Lymphocyte % 18.8  Monocyte % 9.8  Eosinophil % 3.0  Basophil % 0.8  Neutrophil # 4.1  Lymphocyte # 1.1  Monocyte # 0.6  Eosinophil # 0.2  Basophil # 0.0 (Result(s) reported on 27 Jun 2014 at 05:56AM.)   Assessment/Plan:  Assessment/Plan:  Assessment 1) melena, acute on chronic anemia.  hemodynamiclaly stable.  S/P prbc tfx. High risk for sedated proceedure.   2) h/o recent mi , to treatment or intervention with evidence of advance CAD.   Plan 1) continue iv ppi as you are for now.  Serial hgb and tfx as needed. [facility]  Case discussed with Dr COliva Bustard   Electronic Signatures: SLoistine Simas(MD)  (Signed 09-Jul-15 17:32)  Authored: Chief Complaint, VITAL SIGNS/ANCILLARY NOTES, Brief Assessment, Lab Results, Assessment/Plan   Last Updated: 09-Jul-15 17:32 by SLoistine Simas(MD)

## 2015-06-09 ENCOUNTER — Ambulatory Visit: Payer: Medicare Other | Admitting: Oncology

## 2015-06-09 ENCOUNTER — Ambulatory Visit: Payer: Medicare Other

## 2015-06-09 ENCOUNTER — Other Ambulatory Visit: Payer: Medicare Other

## 2015-06-17 ENCOUNTER — Other Ambulatory Visit: Payer: Self-pay

## 2015-06-17 DIAGNOSIS — D649 Anemia, unspecified: Secondary | ICD-10-CM

## 2015-06-18 ENCOUNTER — Encounter: Payer: Self-pay | Admitting: Oncology

## 2015-06-18 ENCOUNTER — Inpatient Hospital Stay: Payer: Medicare Other

## 2015-06-18 ENCOUNTER — Inpatient Hospital Stay: Payer: Medicare Other | Attending: Oncology | Admitting: Oncology

## 2015-06-18 VITALS — BP 153/78 | HR 72 | Wt 138.2 lb

## 2015-06-18 DIAGNOSIS — Z79899 Other long term (current) drug therapy: Secondary | ICD-10-CM | POA: Diagnosis not present

## 2015-06-18 DIAGNOSIS — I10 Essential (primary) hypertension: Secondary | ICD-10-CM | POA: Insufficient documentation

## 2015-06-18 DIAGNOSIS — E039 Hypothyroidism, unspecified: Secondary | ICD-10-CM | POA: Diagnosis not present

## 2015-06-18 DIAGNOSIS — Z90710 Acquired absence of both cervix and uterus: Secondary | ICD-10-CM

## 2015-06-18 DIAGNOSIS — E785 Hyperlipidemia, unspecified: Secondary | ICD-10-CM | POA: Insufficient documentation

## 2015-06-18 DIAGNOSIS — F0390 Unspecified dementia without behavioral disturbance: Secondary | ICD-10-CM | POA: Insufficient documentation

## 2015-06-18 DIAGNOSIS — Z85038 Personal history of other malignant neoplasm of large intestine: Secondary | ICD-10-CM | POA: Diagnosis not present

## 2015-06-18 DIAGNOSIS — D649 Anemia, unspecified: Secondary | ICD-10-CM

## 2015-06-18 DIAGNOSIS — D500 Iron deficiency anemia secondary to blood loss (chronic): Secondary | ICD-10-CM | POA: Insufficient documentation

## 2015-06-18 HISTORY — DX: Iron deficiency anemia secondary to blood loss (chronic): D50.0

## 2015-06-18 LAB — CBC
HCT: 35.4 % (ref 35.0–47.0)
Hemoglobin: 11.6 g/dL — ABNORMAL LOW (ref 12.0–16.0)
MCH: 28.4 pg (ref 26.0–34.0)
MCHC: 32.7 g/dL (ref 32.0–36.0)
MCV: 86.8 fL (ref 80.0–100.0)
Platelets: 227 10*3/uL (ref 150–440)
RBC: 4.08 MIL/uL (ref 3.80–5.20)
RDW: 14.6 % — AB (ref 11.5–14.5)
WBC: 5.6 10*3/uL (ref 3.6–11.0)

## 2015-06-18 LAB — SAMPLE TO BLOOD BANK

## 2015-06-18 NOTE — Progress Notes (Signed)
No results found for: Nils Pyle [place] @ [facility] [phone]  Fax:([phone]) [place]: Jun 04, 1923  MR#: [id number]  EHU#:[id number]  Patient Care Team: No Pcp Per Patient as PCP - General (General Practice)  CHIEF COMPLAINT:  Chief Complaint  Patient presents with  . Follow-up     No history exists.   iron deficiency anemia due to chronic blood loss Dementia  No flowsheet data found.  INTERVAL HISTORY: 81 year old lady with were has chronic iron deficiency anemia most likely due to chronic GI blood loss.  Patient had undergone previous colonoscopy upper GI C workup at [facility].  In the past had received iron infusion as well as blood transfusion lately patient has been feeling better.  Has significant dementia and lives in a sister living facility.  No blood loss reported at present time hemoglobin is stable.  REVIEW OF SYSTEMS:  GENERAL:  Feels good.  Active.  No fevers, sweats or weight loss. PERFORMANCE STATUS (ECOG):  01 HEENT:  No visual changes, runny nose, sore throat, mouth sores or tenderness. Lungs: No shortness of breath or cough.  No hemoptysis. Cardiac:  No chest pain, palpitations, orthopnea, or PND. GI:  No nausea, vomiting, diarrhea, constipation, melena or hematochezia. GU:  No urgency, frequency, dysuria, or hematuria. Musculoskeletal:  No back pain.  No joint pain.  No muscle tenderness. Extremities:  No pain or swelling. Skin:  No rashes or skin changes. Neuro:  No headache, numbness or weakness, balance or coordination issues. Endocrine:  No diabetes, thyroid issues, hot flashes or night sweats. Psych:  No mood changes, depression or anxiety. She  has significant senile dementia Pain:  No focal pain. Review of systems:  All other systems reviewed and found to be negative. As per HPI. Otherwise, a complete review of systems is negatve.  PAST MEDICAL HISTORY: Past Medical History    Diagnosis Date  . Iron deficiency anemia due to chronic blood loss 06/18/2015    Allergies:  Flagyl: Unknown  Phenobarbital: Unknown  Ranitidine: Unknown  Significant History/PMH:   Cancer, Colon or Rectal:    Osteoarthritis:    Hypercholesterolemia:    HTN:    Hypothyroidism:    Colon Resection:    Ruptured Disk:    Hysterectomy - Total:   Preventive Screening:  Has patient had any of the following test? Colonscopy  Mammography (1)   Last Colonoscopy: unable to remember(1)   Last Mammography: 2013?(1)   Smoking History: Smoking History Never Smoked.(1)  PFSH: Comments: No family history of colorectal cancer, breast cancer, or ovarian cancer.  Social History: negative alcohol, negative tobacco  Additional Past Medical and Surgical History: As mentioned above   History of colon cancer patient underwent right hemicolectomy at [place]:  Patient does have living will HEALTH MAINTENANCE: History  Substance Use Topics  . Smoking status: Never Smoker   . Smokeless tobacco: Not on file  . Alcohol Use: Not on file      No Known Allergies  Current Outpatient Prescriptions  Medication Sig Dispense Refill  . amLODipine (NORVASC) 5 MG tablet     . atorvastatin (LIPITOR) 40 MG tablet     . citalopram (CELEXA) 20 MG tablet     . donepezil (ARICEPT) 5 MG tablet     . levothyroxine (SYNTHROID, LEVOTHROID) 75 MCG tablet     . omeprazole (PRILOSEC) 20 MG capsule      No current facility-administered  medications for this visit.    OBJECTIVE:  Filed Vitals:   06/18/15 0915  BP: 153/78  Pulse: 72     There is no height on file to calculate BMI.    ECOG FS:1 - Symptomatic but completely ambulatory  PHYSICAL EXAM: GENERAL:  Well developed, well nourished, sitting comfortably in the exam room in no acute distress. MENTAL STATUS:  Alert and oriented to person, place and time. HEAD: .  Normocephalic, atraumatic, face symmetric, no  Cushingoid features. EYES:  .  Pupils equal round and reactive to light and accomodation.  No conjunctivitis or scleral icterus. ENT:  Oropharynx clear without lesion.  Tongue normal. Mucous membranes moist.  RESPIRATORY:  Clear to auscultation without rales, wheezes or rhonchi. CARDIOVASCULAR:  Regular rate and rhythm without murmur, rub or gallop. BREAST:  Right breast without masses, skin changes or nipple discharge.  Left breast without masses, skin changes or nipple discharge. ABDOMEN:  Soft, non-tender, with active bowel sounds, and no hepatosplenomegaly.  No masses. BACK:  No CVA tenderness.  No tenderness on percussion of the back or rib cage. SKIN:  No rashes, ulcers or lesions. EXTREMITIES: No edema, no skin discoloration or tenderness.  No palpable cords. LYMPH NODES: No palpable cervical, supraclavicular, axillary or inguinal adenopathy  NEUROLOGICAL: Unremarkable. PSYCH:  Appropriate.   LAB RESULTS:  Appointment on 06/18/2015  Component Date Value Ref Range Status  . WBC 06/18/2015 5.6  3.6 - 11.0 K/uL Final  . RBC 06/18/2015 4.08  3.80 - 5.20 MIL/uL Final  . Hemoglobin 06/18/2015 11.6* 12.0 - 16.0 g/dL Final  . HCT 06/18/2015 35.4  35.0 - 47.0 % Final  . MCV 06/18/2015 86.8  80.0 - 100.0 fL Final  . MCH 06/18/2015 28.4  26.0 - 34.0 pg Final  . MCHC 06/18/2015 32.7  32.0 - 36.0 g/dL Final  . RDW 06/18/2015 14.6* 11.5 - 14.5 % Final  . Platelets 06/18/2015 227  150 - 440 K/uL Final      STUDIES: No results found.  ASSESSMENT: Iron deficiency anemia due to chronic blood loss in the past upper and lower GI series has been reported to be negative  dementia  MEDICAL DECISION MAKING:  Hemoglobin is stable 11.6 no blood transfusion or iron transfusion needed recheck hemoglobin A1c in 4 months or before if patient feels weak.  And assessment in 4 months  Patient expressed understanding and was in agreement with this plan. She also understands that She can call clinic at  any time with any questions, concerns, or complaints.    No matching staging information was found for the patient.  Forest Gleason, MD   06/18/2015 9:36 AM

## 2015-09-18 ENCOUNTER — Inpatient Hospital Stay: Payer: Medicare Other

## 2015-09-18 ENCOUNTER — Inpatient Hospital Stay: Payer: Medicare Other | Attending: Oncology

## 2015-09-18 ENCOUNTER — Inpatient Hospital Stay: Payer: Medicare Other | Admitting: Oncology

## 2015-10-09 ENCOUNTER — Ambulatory Visit: Payer: Medicare Other

## 2015-10-09 ENCOUNTER — Inpatient Hospital Stay: Payer: Medicare Other | Attending: Oncology | Admitting: Oncology

## 2015-10-09 ENCOUNTER — Inpatient Hospital Stay: Payer: Medicare Other

## 2015-10-09 VITALS — BP 165/81 | HR 65 | Temp 96.8°F | Wt 139.1 lb

## 2015-10-09 DIAGNOSIS — Z79899 Other long term (current) drug therapy: Secondary | ICD-10-CM | POA: Diagnosis not present

## 2015-10-09 DIAGNOSIS — D509 Iron deficiency anemia, unspecified: Secondary | ICD-10-CM | POA: Diagnosis present

## 2015-10-09 DIAGNOSIS — D500 Iron deficiency anemia secondary to blood loss (chronic): Secondary | ICD-10-CM

## 2015-10-09 DIAGNOSIS — D649 Anemia, unspecified: Secondary | ICD-10-CM

## 2015-10-09 LAB — IRON AND TIBC
IRON: 71 ug/dL (ref 28–170)
Saturation Ratios: 23 % (ref 10.4–31.8)
TIBC: 307 ug/dL (ref 250–450)
UIBC: 236 ug/dL

## 2015-10-09 LAB — CBC WITH DIFFERENTIAL/PLATELET
Basophils Absolute: 0.1 10*3/uL (ref 0–0.1)
Basophils Relative: 1 %
EOS PCT: 5 %
Eosinophils Absolute: 0.3 10*3/uL (ref 0–0.7)
HEMATOCRIT: 34.2 % — AB (ref 35.0–47.0)
Hemoglobin: 11.5 g/dL — ABNORMAL LOW (ref 12.0–16.0)
LYMPHS PCT: 26 %
Lymphs Abs: 1.6 10*3/uL (ref 1.0–3.6)
MCH: 29.9 pg (ref 26.0–34.0)
MCHC: 33.6 g/dL (ref 32.0–36.0)
MCV: 89 fL (ref 80.0–100.0)
Monocytes Absolute: 0.6 10*3/uL (ref 0.2–0.9)
Monocytes Relative: 10 %
NEUTROS ABS: 3.6 10*3/uL (ref 1.4–6.5)
Neutrophils Relative %: 58 %
PLATELETS: 211 10*3/uL (ref 150–440)
RBC: 3.84 MIL/uL (ref 3.80–5.20)
RDW: 14.5 % (ref 11.5–14.5)
WBC: 6.2 10*3/uL (ref 3.6–11.0)

## 2015-10-09 LAB — FERRITIN: Ferritin: 129 ng/mL (ref 11–307)

## 2015-10-09 LAB — SAMPLE TO BLOOD BANK

## 2015-10-10 ENCOUNTER — Inpatient Hospital Stay: Payer: Medicare Other

## 2015-10-10 ENCOUNTER — Encounter: Payer: Self-pay | Admitting: Oncology

## 2015-10-10 NOTE — Progress Notes (Signed)
No results found for: Nils Pyle [place] @ [facility] [phone]  Fax:([phone]) [place] OB: 12/24/1922  MR#: [id number]  ZDG#:[id number]  Patient Care Team: No Pcp Per Patient as PCP - General (General Practice)  HPI   No history exists.   iron deficiency anemia due to chronic blood loss Dementia   INTERVAL HISTORY: 81 year old lady with were has chronic iron deficiency anemia most likely due to chronic GI blood loss.  Patient had undergone previous colonoscopy upper GI C workup at [facility].  In the past had received iron infusion as well as blood transfusion lately patient has been feeling better.  Has significant dementia and lives in a sister living facility.  No blood loss reported at present time hemoglobin is stable.  Patient is here for ongoing evaluation denies any rectal bleeding or abdominal pain.  Patient requires transfusion for iron deficiency anemia.  REVIEW OF SYSTEMS:  GENERAL:  Feels good.  Active.  No fevers, sweats or weight loss. PERFORMANCE STATUS (ECOG):  01 HEENT:  No visual changes, runny nose, sore throat, mouth sores or tenderness. Lungs: No shortness of breath or cough.  No hemoptysis. Cardiac:  No chest pain, palpitations, orthopnea, or PND. GI:  No nausea, vomiting, diarrhea, constipation, melena or hematochezia. GU:  No urgency, frequency, dysuria, or hematuria. Musculoskeletal:  No back pain.  No joint pain.  No muscle tenderness. Extremities:  No pain or swelling. Skin:  No rashes or skin changes. Neuro:  No headache, numbness or weakness, balance or coordination issues. Endocrine:  No diabetes, thyroid issues, hot flashes or night sweats. Psych:  No mood changes, depression or anxiety. She  has significant senile dementia Pain:  No focal pain. Review of systems:  All other systems reviewed and found to be negative. As per HPI. Otherwise, a complete review of systems is  negatve.  PAST MEDICAL HISTORY: Past Medical History  Diagnosis Date  . Iron deficiency anemia due to chronic blood loss 06/18/2015    Allergies:  Flagyl: Unknown  Phenobarbital: Unknown  Ranitidine: Unknown  Significant History/PMH:   Cancer, Colon or Rectal:    Osteoarthritis:    Hypercholesterolemia:    HTN:    Hypothyroidism:    Colon Resection:    Ruptured Disk:    Hysterectomy - Total:   Preventive Screening:  Has patient had any of the following test? Colonscopy  Mammography (1)   Last Colonoscopy: unable to remember(1)   Last Mammography: 2013?(1)   Smoking History: Smoking History Never Smoked.(1)  PFSH: Comments: No family history of colorectal cancer, breast cancer, or ovarian cancer.  Social History: negative alcohol, negative tobacco  Additional Past Medical and Surgical History: As mentioned above   History of colon cancer patient underwent right hemicolectomy at [place]:  Patient does have living will HEALTH MAINTENANCE: Social History  Substance Use Topics  . Smoking status: Never Smoker   . Smokeless tobacco: None  . Alcohol Use: None      No Known Allergies  Current Outpatient Prescriptions  Medication Sig Dispense Refill  . amLODipine (NORVASC) 5 MG tablet     . atorvastatin (LIPITOR) 40 MG tablet     . citalopram (CELEXA) 20 MG tablet     . donepezil (ARICEPT) 5 MG tablet     . levothyroxine (SYNTHROID, LEVOTHROID) 75 MCG tablet     . omeprazole (PRILOSEC) 20 MG capsule      No current  facility-administered medications for this visit.    OBJECTIVE:  Filed Vitals:   10/09/15 0901  BP: 165/81  Pulse: 65  Temp: 96.8 F (36 C)     There is no height on file to calculate BMI.    ECOG FS:1 - Symptomatic but completely ambulatory  PHYSICAL EXAM: GENERAL:  Well developed, well nourished, sitting comfortably in the exam room in no acute distress. MENTAL STATUS:  Alert and oriented to person,  place and time. HEAD: .  Normocephalic, atraumatic, face symmetric, no Cushingoid features. EYES:  .  Pupils equal round and reactive to light and accomodation.  No conjunctivitis or scleral icterus. ENT:  Oropharynx clear without lesion.  Tongue normal. Mucous membranes moist.  RESPIRATORY:  Clear to auscultation without rales, wheezes or rhonchi. CARDIOVASCULAR:  Regular rate and rhythm without murmur, rub or gallop. BREAST:  Right breast without masses, skin changes or nipple discharge.  Left breast without masses, skin changes or nipple discharge. ABDOMEN:  Soft, non-tender, with active bowel sounds, and no hepatosplenomegaly.  No masses. BACK:  No CVA tenderness.  No tenderness on percussion of the back or rib cage. SKIN:  No rashes, ulcers or lesions. EXTREMITIES: No edema, no skin discoloration or tenderness.  No palpable cords. LYMPH NODES: No palpable cervical, supraclavicular, axillary or inguinal adenopathy  NEUROLOGICAL: Unremarkable. PSYCH:  Appropriate.   LAB RESULTS:  Appointment on 10/09/2015  Component Date Value Ref Range Status  . WBC 10/09/2015 6.2  3.6 - 11.0 K/uL Final  . RBC 10/09/2015 3.84  3.80 - 5.20 MIL/uL Final  . Hemoglobin 10/09/2015 11.5* 12.0 - 16.0 g/dL Final  . HCT 10/09/2015 34.2* 35.0 - 47.0 % Final  . MCV 10/09/2015 89.0  80.0 - 100.0 fL Final  . MCH 10/09/2015 29.9  26.0 - 34.0 pg Final  . MCHC 10/09/2015 33.6  32.0 - 36.0 g/dL Final  . RDW 10/09/2015 14.5  11.5 - 14.5 % Final  . Platelets 10/09/2015 211  150 - 440 K/uL Final  . Neutrophils Relative % 10/09/2015 58   Final  . Neutro Abs 10/09/2015 3.6  1.4 - 6.5 K/uL Final  . Lymphocytes Relative 10/09/2015 26   Final  . Lymphs Abs 10/09/2015 1.6  1.0 - 3.6 K/uL Final  . Monocytes Relative 10/09/2015 10   Final  . Monocytes Absolute 10/09/2015 0.6  0.2 - 0.9 K/uL Final  . Eosinophils Relative 10/09/2015 5   Final  . Eosinophils Absolute 10/09/2015 0.3  0 - 0.7 K/uL Final  . Basophils Relative  10/09/2015 1   Final  . Basophils Absolute 10/09/2015 0.1  0 - 0.1 K/uL Final  . Ferritin 10/09/2015 129  11 - 307 ng/mL Final  . Iron 10/09/2015 71  28 - 170 ug/dL Final  . TIBC 10/09/2015 307  250 - 450 ug/dL Final  . Saturation Ratios 10/09/2015 23  10.4 - 31.8 % Final  . UIBC 10/09/2015 236   Final  . Blood Bank Specimen 10/09/2015 SAMPLE AVAILABLE FOR TESTING   Final  . Sample Expiration 10/09/2015 10/12/2015   Final      ASSESSMENT: Iron deficiency anemia due to chronic blood loss in the past upper and lower GI series has been reported to be negative  dementia  MEDICAL DECISION MAKING:  Hemoglobin is stable 11.6 no blood transfusion or iron transfusion needed recheck hemoglobin A1c in 4 months or before if patient feels weak.   Will see patient in 6 month or before he complains of weakness.  Patient was  advised to call us if it starts any rectal bleeding.  Patient expressed understanding and was in agreement with this plan. She also understands that She can call clinic at any time with any questions, concerns, or complaints.    No matching staging information was found for the patient.  Forest Gleason, MD   10/10/2015 8:43 AM

## 2016-04-07 ENCOUNTER — Inpatient Hospital Stay: Payer: Medicare Other

## 2016-04-07 ENCOUNTER — Inpatient Hospital Stay: Payer: Medicare Other | Admitting: Family Medicine

## 2016-04-21 ENCOUNTER — Encounter: Payer: Self-pay | Admitting: Family Medicine

## 2016-04-21 ENCOUNTER — Inpatient Hospital Stay: Payer: Medicare Other

## 2016-04-21 ENCOUNTER — Inpatient Hospital Stay: Payer: Medicare Other | Attending: Oncology | Admitting: Family Medicine

## 2016-04-21 VITALS — BP 167/77 | HR 65 | Temp 97.4°F | Wt 134.6 lb

## 2016-04-21 DIAGNOSIS — F0390 Unspecified dementia without behavioral disturbance: Secondary | ICD-10-CM | POA: Insufficient documentation

## 2016-04-21 DIAGNOSIS — E039 Hypothyroidism, unspecified: Secondary | ICD-10-CM | POA: Diagnosis not present

## 2016-04-21 DIAGNOSIS — K922 Gastrointestinal hemorrhage, unspecified: Secondary | ICD-10-CM | POA: Diagnosis not present

## 2016-04-21 DIAGNOSIS — I2510 Atherosclerotic heart disease of native coronary artery without angina pectoris: Secondary | ICD-10-CM | POA: Diagnosis not present

## 2016-04-21 DIAGNOSIS — D500 Iron deficiency anemia secondary to blood loss (chronic): Secondary | ICD-10-CM | POA: Diagnosis not present

## 2016-04-21 DIAGNOSIS — Z79899 Other long term (current) drug therapy: Secondary | ICD-10-CM | POA: Diagnosis not present

## 2016-04-21 DIAGNOSIS — K219 Gastro-esophageal reflux disease without esophagitis: Secondary | ICD-10-CM | POA: Insufficient documentation

## 2016-04-21 DIAGNOSIS — N189 Chronic kidney disease, unspecified: Secondary | ICD-10-CM | POA: Insufficient documentation

## 2016-04-21 DIAGNOSIS — I129 Hypertensive chronic kidney disease with stage 1 through stage 4 chronic kidney disease, or unspecified chronic kidney disease: Secondary | ICD-10-CM | POA: Diagnosis not present

## 2016-04-21 LAB — COMPREHENSIVE METABOLIC PANEL
ALBUMIN: 4 g/dL (ref 3.5–5.0)
ALT: 17 U/L (ref 14–54)
AST: 24 U/L (ref 15–41)
Alkaline Phosphatase: 79 U/L (ref 38–126)
Anion gap: 8 (ref 5–15)
BUN: 37 mg/dL — AB (ref 6–20)
CHLORIDE: 104 mmol/L (ref 101–111)
CO2: 26 mmol/L (ref 22–32)
Calcium: 9.6 mg/dL (ref 8.9–10.3)
Creatinine, Ser: 2.03 mg/dL — ABNORMAL HIGH (ref 0.44–1.00)
GFR calc Af Amer: 24 mL/min — ABNORMAL LOW (ref >60)
GFR, EST NON AFRICAN AMERICAN: 20 mL/min — AB (ref >60)
Glucose, Bld: 91 mg/dL (ref 65–99)
POTASSIUM: 4.8 mmol/L (ref 3.5–5.1)
SODIUM: 138 mmol/L (ref 135–145)
Total Bilirubin: 0.6 mg/dL (ref 0.3–1.2)
Total Protein: 7.7 g/dL (ref 6.5–8.1)

## 2016-04-21 LAB — CBC WITH DIFFERENTIAL/PLATELET
Basophils Absolute: 0 10*3/uL (ref 0–0.1)
Basophils Relative: 1 %
EOS ABS: 0.3 10*3/uL (ref 0–0.7)
Eosinophils Relative: 3 %
HCT: 32.7 % — ABNORMAL LOW (ref 35.0–47.0)
HEMOGLOBIN: 11.2 g/dL — AB (ref 12.0–16.0)
LYMPHS ABS: 2 10*3/uL (ref 1.0–3.6)
Lymphocytes Relative: 25 %
MCH: 29.6 pg (ref 26.0–34.0)
MCHC: 34.1 g/dL (ref 32.0–36.0)
MCV: 86.9 fL (ref 80.0–100.0)
MONOS PCT: 11 %
Monocytes Absolute: 0.9 10*3/uL (ref 0.2–0.9)
Neutro Abs: 4.9 10*3/uL (ref 1.4–6.5)
Neutrophils Relative %: 60 %
Platelets: 220 10*3/uL (ref 150–440)
RBC: 3.76 MIL/uL — AB (ref 3.80–5.20)
RDW: 13.9 % (ref 11.5–14.5)
WBC: 8.1 10*3/uL (ref 3.6–11.0)

## 2016-04-21 LAB — FERRITIN: Ferritin: 228 ng/mL (ref 11–307)

## 2016-04-21 LAB — IRON AND TIBC
IRON: 41 ug/dL (ref 28–170)
SATURATION RATIOS: 14 % (ref 10.4–31.8)
TIBC: 296 ug/dL (ref 250–450)
UIBC: 255 ug/dL

## 2016-04-21 LAB — SAMPLE TO BLOOD BANK

## 2016-04-21 NOTE — Progress Notes (Signed)
[place]  [phone]  [phone]     [name] [name] DOB: September 24, 1923  MR#: [id number]  PIR#:[id number]  Patient Care Team: No Pcp Per Patient as PCP - General (General Practice)  CHIEF COMPLAINT:  Chief Complaint  Patient presents with  . iron defeciency anemia    INTERVAL HISTORY:  Patient is here for continued follow-up regarding chronic iron deficiency anemia. Previously thought to be related to chronic GI blood loss. Patient currently lives in an assisted living facility and also noted to have significant dementia. She does however given adequate history of present illness and reports no recent GI blood loss, melena, hematochezia, or bright red rectal bleeding. She reports some fatigue but states at 89 that is expected. Patient continues to ambulate well with the assistance of a walker and remains independent at assisted living facility.  REVIEW OF SYSTEMS:   Review of Systems  Constitutional: Positive for malaise/fatigue. Negative for fever, chills, weight loss and diaphoresis.  HENT: Negative.   Eyes: Negative.   Respiratory: Negative for cough, hemoptysis, sputum production, shortness of breath and wheezing.   Cardiovascular: Negative for chest pain, palpitations, orthopnea, claudication, leg swelling and PND.  Gastrointestinal: Negative for heartburn, nausea, vomiting, abdominal pain, diarrhea, constipation, blood in stool and melena.  Genitourinary: Negative.   Musculoskeletal: Positive for joint pain.  Skin: Negative.   Neurological: Negative for dizziness, tingling, focal weakness, seizures and weakness.  Endo/Heme/Allergies: Does not bruise/bleed easily.  Psychiatric/Behavioral: Negative for depression. The patient is not nervous/anxious and does not have insomnia.     As per HPI. Otherwise, a complete review of systems is negatve.  ONCOLOGY HISTORY:  No history exists.    PAST MEDICAL HISTORY: Past Medical History  Diagnosis  Date  . Iron deficiency anemia due to chronic blood loss 06/18/2015  . Colon cancer ([place])   . Hypertension   . GERD (gastroesophageal reflux disease)   . Dementia   . Hypothyroidism   . Chronic kidney disease   . CAD (coronary artery disease)     PAST SURGICAL HISTORY: Past Surgical History  Procedure Laterality Date  . Abdominal hysterectomy    . Hemicolectomy      FAMILY HISTORY Family History  Problem Relation Age of Onset  . Cancer Mother     GYNECOLOGIC HISTORY:  No LMP recorded.     ADVANCED DIRECTIVES:    HEALTH MAINTENANCE: Social History  Substance Use Topics  . Smoking status: Never Smoker   . Smokeless tobacco: Never Used  . Alcohol Use: No     No Known Allergies  Current Outpatient Prescriptions  Medication Sig Dispense Refill  . acetaminophen (TYLENOL) 325 MG tablet Take 650 mg by mouth every 6 (six) hours as needed.    Marland Kitchen amLODipine (NORVASC) 5 MG tablet     . atorvastatin (LIPITOR) 40 MG tablet     . citalopram (CELEXA) 20 MG tablet     . donepezil (ARICEPT) 5 MG tablet     . levothyroxine (SYNTHROID, LEVOTHROID) 75 MCG tablet     . omeprazole (PRILOSEC) 20 MG capsule      No current facility-administered medications for this visit.    OBJECTIVE: BP 167/77 mmHg  Pulse 65  Temp(Src) 97.4 F (36.3 C) (Tympanic)  Wt 134 lb 9.5 oz (61.05 kg)   There is no height on file to calculate BMI.    ECOG FS:1 - Symptomatic but completely ambulatory  General: Well-developed, well-nourished, no acute distress. Eyes: Pink conjunctiva, anicteric sclera. HEENT:  Normocephalic, moist mucous membranes, clear oropharnyx. Lungs: Clear to auscultation bilaterally. Heart: Regular rate and rhythm. No rubs, murmurs, or gallops. Abdomen: Soft, nontender, nondistended. No organomegaly noted, normoactive bowel sounds. Musculoskeletal: No edema, cyanosis, or clubbing. Neuro: Alert, answering all questions appropriately. Cranial nerves grossly intact. Skin: No rashes  or petechiae noted. Psych: Normal affect. Lymphatics: No cervical, clavicular LAD.   LAB RESULTS:  Clinical Support on 04/21/2016  Component Date Value Ref Range Status  . Sodium 04/21/2016 138  135 - 145 mmol/L Final  . Potassium 04/21/2016 4.8  3.5 - 5.1 mmol/L Final  . Chloride 04/21/2016 104  101 - 111 mmol/L Final  . CO2 04/21/2016 26  22 - 32 mmol/L Final  . Glucose, Bld 04/21/2016 91  65 - 99 mg/dL Final  . BUN 04/21/2016 37* 6 - 20 mg/dL Final  . Creatinine, Ser 04/21/2016 2.03* 0.44 - 1.00 mg/dL Final  . Calcium 04/21/2016 9.6  8.9 - 10.3 mg/dL Final  . Total Protein 04/21/2016 7.7  6.5 - 8.1 g/dL Final  . Albumin 04/21/2016 4.0  3.5 - 5.0 g/dL Final  . AST 04/21/2016 24  15 - 41 U/L Final  . ALT 04/21/2016 17  14 - 54 U/L Final  . Alkaline Phosphatase 04/21/2016 79  38 - 126 U/L Final  . Total Bilirubin 04/21/2016 0.6  0.3 - 1.2 mg/dL Final  . GFR calc non Af Amer 04/21/2016 20* >60 mL/min Final  . GFR calc Af Amer 04/21/2016 24* >60 mL/min Final   Comment: (NOTE) The eGFR has been calculated using the CKD EPI equation. This calculation has not been validated in all clinical situations. eGFR's persistently <60 mL/min signify possible Chronic Kidney Disease.   . Anion gap 04/21/2016 8  5 - 15 Final  . WBC 04/21/2016 8.1  3.6 - 11.0 K/uL Final  . RBC 04/21/2016 3.76* 3.80 - 5.20 MIL/uL Final  . Hemoglobin 04/21/2016 11.2* 12.0 - 16.0 g/dL Final  . HCT 04/21/2016 32.7* 35.0 - 47.0 % Final  . MCV 04/21/2016 86.9  80.0 - 100.0 fL Final  . MCH 04/21/2016 29.6  26.0 - 34.0 pg Final  . MCHC 04/21/2016 34.1  32.0 - 36.0 g/dL Final  . RDW 04/21/2016 13.9  11.5 - 14.5 % Final  . Platelets 04/21/2016 220  150 - 440 K/uL Final  . Neutrophils Relative % 04/21/2016 60   Final  . Neutro Abs 04/21/2016 4.9  1.4 - 6.5 K/uL Final  . Lymphocytes Relative 04/21/2016 25   Final  . Lymphs Abs 04/21/2016 2.0  1.0 - 3.6 K/uL Final  . Monocytes Relative 04/21/2016 11   Final  .  Monocytes Absolute 04/21/2016 0.9  0.2 - 0.9 K/uL Final  . Eosinophils Relative 04/21/2016 3   Final  . Eosinophils Absolute 04/21/2016 0.3  0 - 0.7 K/uL Final  . Basophils Relative 04/21/2016 1   Final  . Basophils Absolute 04/21/2016 0.0  0 - 0.1 K/uL Final    STUDIES: No results found.  ASSESSMENT:  Iron deficiency anemia, secondary to chronic GI blood loss.  PLAN:   1. IDA. Patient has previously had an upper and lower GI series that were reported as negative. Patient denies any acute signs or symptoms of bleeding. Continues to feel some fatigue however this has not worsened in the last 6 months. Hemoglobin remains stable at 11.2, ferritin 228, iron 41. She does not require any further treatment at this time.  We'll continue with routine follow-up in approximately 6 months.  Patient expressed understanding and was in agreement with this plan. She also understands that She can call clinic at any time with any questions, concerns, or complaints.   Dr. Oliva Bustard was available for consultation and review of plan of care for this patient.   Evlyn Kanner, NP   04/21/2016 10:12 AM

## 2016-10-06 ENCOUNTER — Inpatient Hospital Stay: Payer: Medicare Other | Attending: Internal Medicine

## 2016-10-06 ENCOUNTER — Inpatient Hospital Stay (HOSPITAL_BASED_OUTPATIENT_CLINIC_OR_DEPARTMENT_OTHER): Payer: Medicare Other | Admitting: Internal Medicine

## 2016-10-06 ENCOUNTER — Encounter: Payer: Self-pay | Admitting: Internal Medicine

## 2016-10-06 VITALS — BP 133/73 | HR 64 | Temp 96.3°F | Wt 135.1 lb

## 2016-10-06 DIAGNOSIS — Z79899 Other long term (current) drug therapy: Secondary | ICD-10-CM

## 2016-10-06 DIAGNOSIS — D500 Iron deficiency anemia secondary to blood loss (chronic): Secondary | ICD-10-CM

## 2016-10-06 DIAGNOSIS — N184 Chronic kidney disease, stage 4 (severe): Secondary | ICD-10-CM

## 2016-10-06 DIAGNOSIS — I129 Hypertensive chronic kidney disease with stage 1 through stage 4 chronic kidney disease, or unspecified chronic kidney disease: Secondary | ICD-10-CM

## 2016-10-06 DIAGNOSIS — I2510 Atherosclerotic heart disease of native coronary artery without angina pectoris: Secondary | ICD-10-CM | POA: Diagnosis not present

## 2016-10-06 DIAGNOSIS — N189 Chronic kidney disease, unspecified: Secondary | ICD-10-CM | POA: Insufficient documentation

## 2016-10-06 DIAGNOSIS — N183 Chronic kidney disease, stage 3 unspecified: Secondary | ICD-10-CM

## 2016-10-06 DIAGNOSIS — E039 Hypothyroidism, unspecified: Secondary | ICD-10-CM | POA: Insufficient documentation

## 2016-10-06 DIAGNOSIS — D631 Anemia in chronic kidney disease: Secondary | ICD-10-CM | POA: Insufficient documentation

## 2016-10-06 DIAGNOSIS — K219 Gastro-esophageal reflux disease without esophagitis: Secondary | ICD-10-CM | POA: Insufficient documentation

## 2016-10-06 LAB — COMPREHENSIVE METABOLIC PANEL
ALK PHOS: 83 U/L (ref 38–126)
ALT: 16 U/L (ref 14–54)
AST: 27 U/L (ref 15–41)
Albumin: 3.8 g/dL (ref 3.5–5.0)
Anion gap: 10 (ref 5–15)
BUN: 31 mg/dL — ABNORMAL HIGH (ref 6–20)
CALCIUM: 9.1 mg/dL (ref 8.9–10.3)
CHLORIDE: 101 mmol/L (ref 101–111)
CO2: 24 mmol/L (ref 22–32)
CREATININE: 1.85 mg/dL — AB (ref 0.44–1.00)
GFR, EST AFRICAN AMERICAN: 26 mL/min — AB (ref >60)
GFR, EST NON AFRICAN AMERICAN: 23 mL/min — AB (ref >60)
Glucose, Bld: 108 mg/dL — ABNORMAL HIGH (ref 65–99)
Potassium: 4.8 mmol/L (ref 3.5–5.1)
Sodium: 135 mmol/L (ref 135–145)
Total Bilirubin: 0.5 mg/dL (ref 0.3–1.2)
Total Protein: 7.2 g/dL (ref 6.5–8.1)

## 2016-10-06 LAB — CBC WITH DIFFERENTIAL/PLATELET
Basophils Absolute: 0.1 10*3/uL (ref 0–0.1)
Basophils Relative: 1 %
EOS PCT: 4 %
Eosinophils Absolute: 0.3 10*3/uL (ref 0–0.7)
HCT: 31.5 % — ABNORMAL LOW (ref 35.0–47.0)
Hemoglobin: 10.7 g/dL — ABNORMAL LOW (ref 12.0–16.0)
LYMPHS ABS: 2.2 10*3/uL (ref 1.0–3.6)
LYMPHS PCT: 33 %
MCH: 29.3 pg (ref 26.0–34.0)
MCHC: 33.9 g/dL (ref 32.0–36.0)
MCV: 86.5 fL (ref 80.0–100.0)
Monocytes Absolute: 0.8 10*3/uL (ref 0.2–0.9)
Monocytes Relative: 12 %
Neutro Abs: 3.4 10*3/uL (ref 1.4–6.5)
Neutrophils Relative %: 50 %
PLATELETS: 249 10*3/uL (ref 150–440)
RBC: 3.65 MIL/uL — AB (ref 3.80–5.20)
RDW: 13.8 % (ref 11.5–14.5)
WBC: 6.7 10*3/uL (ref 3.6–11.0)

## 2016-10-06 LAB — IRON AND TIBC
IRON: 50 ug/dL (ref 28–170)
Saturation Ratios: 20 % (ref 10.4–31.8)
TIBC: 253 ug/dL (ref 250–450)
UIBC: 203 ug/dL

## 2016-10-06 LAB — FERRITIN: FERRITIN: 316 ng/mL — AB (ref 11–307)

## 2016-10-14 NOTE — Progress Notes (Signed)
[place]  [phone] [phone]  ID: [name] [name] OB: 1923-05-25  MR#: [id number]  [id number]  Patient Care Team: No Pcp Per Patient as PCP - General (General Practice)  CHIEF COMPLAINT: Anemia    INTERVAL HISTORY:  This is a very pleasant elderly lady who was seen last here for IDA.   Patient has previously had an upper and lower GI series that were reported as negative. Patient is here for continued follow-up regarding chronic iron deficiency anemia. Previously thought to be related to chronic GI blood loss. But Gi work up had been negative and she had maintained her Hgb without need for iron supplementation over the past year.  She remains in the assisted living facility with apparent improvement in Dementia. She is by herself today and is communicative, good historian and oriented with no significant memory lapses noted.  She  reports no , melena, hematochezia, or bright red rectal bleeding. No excess fatigue, SOB, or chest pain. She continues to ambulate well with the assistance of a walker and remains independent at assisted living facility.   REVIEW OF SYSTEMS:   Review of Systems  Constitutional: Negative.   HENT: Negative.   Eyes: Negative.   Respiratory: Negative.   Gastrointestinal: Negative.   Genitourinary: Negative.   Musculoskeletal: Positive for joint pain.  Skin: Negative.   Neurological: Negative.   Endo/Heme/Allergies: Negative.   Psychiatric/Behavioral: Negative.   All other systems reviewed and are negative.   As per HPI. Otherwise, a complete review of systems is negative.  PAST MEDICAL HISTORY: Past Medical History:  Diagnosis Date  . CAD (coronary artery disease)   . Chronic kidney disease   . Colon cancer ([place])   . Dementia   . GERD (gastroesophageal reflux disease)   . Hypertension   . Hypothyroidism   . Iron deficiency anemia due to chronic blood loss 06/18/2015    PAST SURGICAL HISTORY: Past  Surgical History:  Procedure Laterality Date  . ABDOMINAL HYSTERECTOMY    . HEMICOLECTOMY      FAMILY HISTORY: Family History  Problem Relation Age of Onset  . Cancer Mother     ADVANCED DIRECTIVES (Y/N):  N  HEALTH MAINTENANCE: Social History  Substance Use Topics  . Smoking status: Never Smoker  . Smokeless tobacco: Never Used  . Alcohol use No     Colonoscopy:  PAP:  Bone density:  Lipid panel:  No Known Allergies  Current Outpatient Prescriptions  Medication Sig Dispense Refill  . acetaminophen (TYLENOL) 325 MG tablet Take 650 mg by mouth every 6 (six) hours as needed.    Marland Kitchen amLODipine (NORVASC) 5 MG tablet     . atorvastatin (LIPITOR) 40 MG tablet     . citalopram (CELEXA) 20 MG tablet     . donepezil (ARICEPT) 5 MG tablet     . Fe Fum-FA-B Cmp-C-Zn-Mg-Mn-Cu (HEMOCYTE PLUS PO) Take 1 capsule by mouth daily.    Marland Kitchen levothyroxine (SYNTHROID, LEVOTHROID) 75 MCG tablet     . Multiple Vitamins-Minerals (I-VITE) TABS Take 1 tablet by mouth daily.    Marland Kitchen omeprazole (PRILOSEC) 20 MG capsule      No current facility-administered medications for this visit.     OBJECTIVE: Vitals:   10/06/16 1547  BP: 133/73  Pulse: 64  Temp: (!) 96.3 F (35.7 C)     There is no height or weight on file to calculate BMI.   There is no height or weight on file to calculate BSA.  ECOG FS:2 -  Symptomatic, <50% confined to bed  Physical Exam  Constitutional: She appears well-nourished.  HENT:  Head: Normocephalic and atraumatic.  Eyes: EOM are normal.  Neck: Normal range of motion. Neck supple.  Cardiovascular: Normal rate and regular rhythm.   Pulmonary/Chest: Effort normal and breath sounds normal.  Abdominal: Soft.  Musculoskeletal: She exhibits no edema.  Skin: Skin is warm.  Psychiatric: She has a normal mood and affect. Her behavior is normal. Judgment and thought content normal.     LAB RESULTS:  Lab Results  Component Value Date   NA 135 10/06/2016   K 4.8 10/06/2016    CL 101 10/06/2016   CO2 24 10/06/2016   GLUCOSE 108 (H) 10/06/2016   BUN 31 (H) 10/06/2016   CREATININE 1.85 (H) 10/06/2016   CALCIUM 9.1 10/06/2016   PROT 7.2 10/06/2016   ALBUMIN 3.8 10/06/2016   AST 27 10/06/2016   ALT 16 10/06/2016   ALKPHOS 83 10/06/2016   BILITOT 0.5 10/06/2016   GFRNONAA 23 (L) 10/06/2016   GFRAA 26 (L) 10/06/2016    Lab Results  Component Value Date   WBC 6.7 10/06/2016   NEUTROABS 3.4 10/06/2016   HGB 10.7 (L) 10/06/2016   HCT 31.5 (L) 10/06/2016   MCV 86.5 10/06/2016   PLT 249 10/06/2016     STUDIES: No results found.  ASSESSMENT:   Iron deficiency anemia due to chronic blood loss Personal history f IDA, but currently she is anemic with no laboratory evidence of Iron deficiency Hgb 10.7, no symptoms of fatigue or SOB/ chest pain  Anemia in chronic kidney disease Her hgb today is 10.7, GFR is less than 30 indicative of stage IV chronic kidney disease. I suspect that her anemia is due to resulting epo deficiency. I will confirm with epo level. Nevertheless, she does not require epo supplementation unless she drops to less than 10 and is symptomatic. We will continue to follow. A f/u recommendation note has been sent to her assisted living facility.     PLAN:    Patient expressed understanding and was in agreement with this plan. She also understands that She can call clinic at any time with any questions, concerns, or complaints.  Orders Placed This Encounter  Procedures  . Erythropoietin    Add- on    Standing Status:   Future    Standing Expiration Date:   10/06/2017    Return in about 3 months (around 01/06/2017). No matching staging information was found for the patient.  Creola Corn, MD   10/14/2016 10:08 AM

## 2016-10-14 NOTE — Assessment & Plan Note (Signed)
Personal history f IDA, but currently she is anemic with no laboratory evidence of Iron deficiency Hgb 10.7, no symptoms of fatigue or SOB/ chest pain

## 2016-10-14 NOTE — Assessment & Plan Note (Signed)
Her hgb today is 10.7, GFR is less than 30 indicative of stage IV chronic kidney disease. I suspect that her anemia is due to resulting epo deficiency. I will confirm with epo level. Nevertheless, she does not require epo supplementation unless she drops to less than 10 and is symptomatic. We will continue to follow. A f/u recommendation note has been sent to her assisted living facility.

## 2016-10-21 ENCOUNTER — Ambulatory Visit: Payer: Medicare Other

## 2016-10-21 ENCOUNTER — Other Ambulatory Visit: Payer: Medicare Other

## 2016-10-22 ENCOUNTER — Other Ambulatory Visit: Payer: Medicare Other

## 2016-10-22 ENCOUNTER — Ambulatory Visit: Payer: Medicare Other | Admitting: Family Medicine

## 2016-10-22 ENCOUNTER — Ambulatory Visit: Payer: Medicare Other

## 2017-01-05 ENCOUNTER — Inpatient Hospital Stay: Payer: Medicare Other | Attending: Oncology

## 2017-01-19 ENCOUNTER — Inpatient Hospital Stay: Payer: Medicare Other

## 2017-01-26 ENCOUNTER — Inpatient Hospital Stay: Payer: Medicare Other | Attending: Oncology | Admitting: *Deleted

## 2017-01-26 DIAGNOSIS — N184 Chronic kidney disease, stage 4 (severe): Secondary | ICD-10-CM | POA: Diagnosis not present

## 2017-01-26 DIAGNOSIS — I129 Hypertensive chronic kidney disease with stage 1 through stage 4 chronic kidney disease, or unspecified chronic kidney disease: Secondary | ICD-10-CM | POA: Insufficient documentation

## 2017-01-26 DIAGNOSIS — K219 Gastro-esophageal reflux disease without esophagitis: Secondary | ICD-10-CM | POA: Diagnosis not present

## 2017-01-26 DIAGNOSIS — D631 Anemia in chronic kidney disease: Secondary | ICD-10-CM | POA: Diagnosis not present

## 2017-01-26 DIAGNOSIS — Z79899 Other long term (current) drug therapy: Secondary | ICD-10-CM | POA: Insufficient documentation

## 2017-01-26 DIAGNOSIS — E039 Hypothyroidism, unspecified: Secondary | ICD-10-CM | POA: Insufficient documentation

## 2017-01-26 DIAGNOSIS — I2510 Atherosclerotic heart disease of native coronary artery without angina pectoris: Secondary | ICD-10-CM | POA: Insufficient documentation

## 2017-01-26 LAB — CBC WITH DIFFERENTIAL/PLATELET
BASOS PCT: 1 %
Basophils Absolute: 0.1 10*3/uL (ref 0–0.1)
EOS ABS: 0.2 10*3/uL (ref 0–0.7)
Eosinophils Relative: 3 %
HCT: 32.4 % — ABNORMAL LOW (ref 35.0–47.0)
HEMOGLOBIN: 11 g/dL — AB (ref 12.0–16.0)
Lymphocytes Relative: 30 %
Lymphs Abs: 2 10*3/uL (ref 1.0–3.6)
MCH: 29.8 pg (ref 26.0–34.0)
MCHC: 34 g/dL (ref 32.0–36.0)
MCV: 87.6 fL (ref 80.0–100.0)
MONOS PCT: 10 %
Monocytes Absolute: 0.7 10*3/uL (ref 0.2–0.9)
NEUTROS PCT: 56 %
Neutro Abs: 3.8 10*3/uL (ref 1.4–6.5)
Platelets: 223 10*3/uL (ref 150–440)
RBC: 3.7 MIL/uL — ABNORMAL LOW (ref 3.80–5.20)
RDW: 14.6 % — AB (ref 11.5–14.5)
WBC: 6.9 10*3/uL (ref 3.6–11.0)

## 2017-01-27 LAB — ERYTHROPOIETIN: Erythropoietin: 8.4 m[IU]/mL (ref 2.6–18.5)

## 2017-04-06 ENCOUNTER — Ambulatory Visit: Payer: Medicare Other

## 2017-04-06 ENCOUNTER — Other Ambulatory Visit: Payer: Medicare Other

## 2017-04-07 ENCOUNTER — Inpatient Hospital Stay: Payer: Medicare Other | Admitting: Oncology

## 2017-04-07 ENCOUNTER — Inpatient Hospital Stay: Payer: Medicare Other | Attending: Oncology

## 2017-05-06 ENCOUNTER — Inpatient Hospital Stay: Payer: Medicare Other | Attending: Oncology | Admitting: Oncology

## 2017-05-06 ENCOUNTER — Inpatient Hospital Stay: Payer: Medicare Other

## 2017-05-06 VITALS — BP 122/73 | HR 69 | Temp 98.0°F | Resp 18 | Wt 127.2 lb

## 2017-05-06 DIAGNOSIS — N189 Chronic kidney disease, unspecified: Principal | ICD-10-CM

## 2017-05-06 DIAGNOSIS — I2510 Atherosclerotic heart disease of native coronary artery without angina pectoris: Secondary | ICD-10-CM | POA: Insufficient documentation

## 2017-05-06 DIAGNOSIS — K219 Gastro-esophageal reflux disease without esophagitis: Secondary | ICD-10-CM | POA: Insufficient documentation

## 2017-05-06 DIAGNOSIS — I129 Hypertensive chronic kidney disease with stage 1 through stage 4 chronic kidney disease, or unspecified chronic kidney disease: Secondary | ICD-10-CM

## 2017-05-06 DIAGNOSIS — D631 Anemia in chronic kidney disease: Secondary | ICD-10-CM | POA: Diagnosis not present

## 2017-05-06 DIAGNOSIS — Z79899 Other long term (current) drug therapy: Secondary | ICD-10-CM | POA: Insufficient documentation

## 2017-05-06 DIAGNOSIS — E039 Hypothyroidism, unspecified: Secondary | ICD-10-CM | POA: Diagnosis not present

## 2017-05-06 DIAGNOSIS — F0390 Unspecified dementia without behavioral disturbance: Secondary | ICD-10-CM | POA: Insufficient documentation

## 2017-05-06 DIAGNOSIS — Z85038 Personal history of other malignant neoplasm of large intestine: Secondary | ICD-10-CM

## 2017-05-06 LAB — CBC
HEMATOCRIT: 30.8 % — AB (ref 35.0–47.0)
HEMOGLOBIN: 10.6 g/dL — AB (ref 12.0–16.0)
MCH: 29.7 pg (ref 26.0–34.0)
MCHC: 34.3 g/dL (ref 32.0–36.0)
MCV: 86.6 fL (ref 80.0–100.0)
Platelets: 248 10*3/uL (ref 150–440)
RBC: 3.55 MIL/uL — AB (ref 3.80–5.20)
RDW: 14.5 % (ref 11.5–14.5)
WBC: 7.1 10*3/uL (ref 3.6–11.0)

## 2017-05-06 LAB — FERRITIN: Ferritin: 220 ng/mL (ref 11–307)

## 2017-05-06 LAB — COMPREHENSIVE METABOLIC PANEL
ALT: 15 U/L (ref 14–54)
ANION GAP: 8 (ref 5–15)
AST: 27 U/L (ref 15–41)
Albumin: 3.9 g/dL (ref 3.5–5.0)
Alkaline Phosphatase: 86 U/L (ref 38–126)
BUN: 27 mg/dL — ABNORMAL HIGH (ref 6–20)
CHLORIDE: 105 mmol/L (ref 101–111)
CO2: 26 mmol/L (ref 22–32)
Calcium: 9.1 mg/dL (ref 8.9–10.3)
Creatinine, Ser: 2.14 mg/dL — ABNORMAL HIGH (ref 0.44–1.00)
GFR calc non Af Amer: 19 mL/min — ABNORMAL LOW (ref >60)
GFR, EST AFRICAN AMERICAN: 22 mL/min — AB (ref >60)
Glucose, Bld: 94 mg/dL (ref 65–99)
Potassium: 4.8 mmol/L (ref 3.5–5.1)
SODIUM: 139 mmol/L (ref 135–145)
Total Bilirubin: 0.3 mg/dL (ref 0.3–1.2)
Total Protein: 7.4 g/dL (ref 6.5–8.1)

## 2017-05-06 LAB — IRON AND TIBC
IRON: 39 ug/dL (ref 28–170)
SATURATION RATIOS: 15 % (ref 10.4–31.8)
TIBC: 254 ug/dL (ref 250–450)
UIBC: 215 ug/dL

## 2017-05-06 NOTE — Progress Notes (Signed)
Hematology/Oncology Consult note [facility]  [phone] [phone]  Patient Care Team: Patient, No Pcp Per as PCP - General (General Practice)   Name of the patient: [name] [name]  [id number]  Oct 05, 1923   Date of visit: 05/06/17  Diagnosis- anemia of chronic kidney disease and possible iron deficiency anemia  Chief complaint/ Reason for visit- routine f/u  Heme/Onc history: Patient is a 83 year old female who was seen in the past for iron deficiency anemia that was thought to be due to chronic GI blood loss. She had a GI workup done which was negative. She maintained her hemoglobin around 10 without need for IV iron or oral iron over the last 1 year. She was last seen in October 2017 and at that time her anemia was attributed to chronic kidney disease. She has not required EPO shots so far  Interval history- has memory issues due to dementia. Reports doing well.denies any bleeding    Review of systems- Review of Systems  Constitutional: Negative for chills, fever, malaise/fatigue and weight loss.  HENT: Negative for congestion, ear discharge and nosebleeds.   Eyes: Negative for blurred vision.  Respiratory: Negative for cough, hemoptysis, sputum production, shortness of breath and wheezing.   Cardiovascular: Negative for chest pain, palpitations, orthopnea and claudication.  Gastrointestinal: Negative for abdominal pain, blood in stool, constipation, diarrhea, heartburn, melena, nausea and vomiting.  Genitourinary: Negative for dysuria, flank pain, frequency, hematuria and urgency.  Musculoskeletal: Negative for back pain, joint pain and myalgias.  Skin: Negative for rash.  Neurological: Negative for dizziness, tingling, focal weakness, seizures, weakness and headaches.  Endo/Heme/Allergies: Does not bruise/bleed easily.  Psychiatric/Behavioral: Negative for depression and suicidal ideas. The patient does not have insomnia.       Current treatment- observation  No Known Allergies   Past Medical History:  Diagnosis Date  . CAD (coronary artery disease)   . Chronic kidney disease   . Colon cancer ([place])   . Dementia   . GERD (gastroesophageal reflux disease)   . Hypertension   . Hypothyroidism   . Iron deficiency anemia due to chronic blood loss 06/18/2015     Past Surgical History:  Procedure Laterality Date  . ABDOMINAL HYSTERECTOMY    . HEMICOLECTOMY      Social History   Social History  . Marital status: Widowed    Spouse name: N/A  . Number of children: N/A  . Years of education: N/A   Occupational History  . Not on file.   Social History Main Topics  . Smoking status: Never Smoker  . Smokeless tobacco: Never Used  . Alcohol use No  . Drug use: No  . Sexual activity: No   Other Topics Concern  . Not on file   Social History Narrative  . No narrative on file    Family History  Problem Relation Age of Onset  . Cancer Mother      Current Outpatient Prescriptions:  .  acetaminophen (TYLENOL) 325 MG tablet, Take 650 mg by mouth every 6 (six) hours as needed., Disp: , Rfl:  .  amLODipine (NORVASC) 5 MG tablet, , Disp: , Rfl:  .  atorvastatin (LIPITOR) 40 MG tablet, , Disp: , Rfl:  .  citalopram (CELEXA) 20 MG tablet, , Disp: , Rfl:  .  donepezil (ARICEPT) 5 MG tablet, , Disp: , Rfl:  .  Fe Fum-FA-B Cmp-C-Zn-Mg-Mn-Cu (HEMOCYTE PLUS PO), Take 1 capsule by mouth daily., Disp: , Rfl:  .  levothyroxine (  SYNTHROID, LEVOTHROID) 75 MCG tablet, , Disp: , Rfl:  .  Multiple Vitamins-Minerals (I-VITE) TABS, Take 1 tablet by mouth daily., Disp: , Rfl:  .  omeprazole (PRILOSEC) 20 MG capsule, , Disp: , Rfl:   Physical exam: There were no vitals filed for this visit. Physical Exam  Constitutional: She is oriented to person, place, and time and well-developed, well-nourished, and in no distress.  HENT:  Head: Normocephalic and atraumatic.  Eyes: EOM are normal. Pupils are equal, round,  and reactive to light.  Neck: Normal range of motion.  Cardiovascular: Normal rate and regular rhythm.   Murmur (ejection systolic murmur +) heard. Pulmonary/Chest: Effort normal and breath sounds normal.  Abdominal: Soft. Bowel sounds are normal.  Neurological: She is alert and oriented to person, place, and time.  Skin: Skin is warm and dry.     CMP Latest Ref Rng & Units 05/06/2017  Glucose 65 - 99 mg/dL 94  BUN 6 - 20 mg/dL 27(H)  Creatinine 0.44 - 1.00 mg/dL 2.14(H)  Sodium 135 - 145 mmol/L 139  Potassium 3.5 - 5.1 mmol/L 4.8  Chloride 101 - 111 mmol/L 105  CO2 22 - 32 mmol/L 26  Calcium 8.9 - 10.3 mg/dL 9.1  Total Protein 6.5 - 8.1 g/dL 7.4  Total Bilirubin 0.3 - 1.2 mg/dL 0.3  Alkaline Phos 38 - 126 U/L 86  AST 15 - 41 U/L 27  ALT 14 - 54 U/L 15   CBC Latest Ref Rng & Units 05/06/2017  WBC 3.6 - 11.0 K/uL 7.1  Hemoglobin 12.0 - 16.0 g/dL 10.6(L)  Hematocrit 35.0 - 47.0 % 30.8(L)  Platelets 150 - 440 K/uL 248      Assessment and plan- Patient is a 83 y.o. female who sees [place] for anemia of chronic kidney disease  Her H/H today is 10.6/30.8 which is overall stable as compared to prior values. Her iron studies are pending but her mcv is normocytic which argues against overt iron deficiency. Since her hb>10, no indication for EPO at this time. Repeat cbc  In 3 months and I will see her back in 6 months with cbc and iron studies   Visit Diagnosis 1. Anemia in chronic kidney disease, unspecified CKD stage      Dr. Randa Evens, MD, MPH [facility] at [facility] Pager- [phone] 05/06/2017 3:17 PM

## 2017-05-06 NOTE — Progress Notes (Signed)
Pt here for follow up. Lives in [place] asst living. Uncertain of all meds-med list noted w pt and meds reconciled. ,

## 2017-05-07 ENCOUNTER — Encounter: Payer: Self-pay | Admitting: Oncology

## 2017-08-05 ENCOUNTER — Inpatient Hospital Stay: Payer: Medicare Other | Attending: Oncology

## 2017-11-04 ENCOUNTER — Inpatient Hospital Stay: Payer: Medicare Other | Attending: Oncology | Admitting: Oncology

## 2017-11-04 ENCOUNTER — Inpatient Hospital Stay: Payer: Medicare Other

## 2017-12-12 ENCOUNTER — Inpatient Hospital Stay
Admit: 2017-12-12 | Discharge: 2017-12-12 | Disposition: A | Discharge status: Home / Self Care | Payer: MEDICARE | Attending: Emergency Medicine

## 2017-12-12 ENCOUNTER — Emergency Department: Admit: 2017-12-12 | Payer: MEDICARE | Primary: Family Medicine

## 2017-12-12 DIAGNOSIS — G43009 Migraine without aura, not intractable, without status migrainosus: Secondary | ICD-10-CM

## 2017-12-12 LAB — CBC WITH AUTOMATED DIFF
ABS. BASOPHILS: 0.1 10*3/uL (ref 0.0–0.1)
ABS. EOSINOPHILS: 0.1 10*3/uL (ref 0.0–0.4)
ABS. IMM. GRANS.: 0 10*3/uL (ref 0.00–0.04)
ABS. LYMPHOCYTES: 1.2 10*3/uL (ref 0.8–3.5)
ABS. MONOCYTES: 0.6 10*3/uL (ref 0.0–1.0)
ABS. NEUTROPHILS: 6.8 10*3/uL (ref 1.8–8.0)
ABSOLUTE NRBC: 0 10*3/uL (ref 0.00–0.01)
BASOPHILS: 1 % (ref 0–1)
EOSINOPHILS: 2 % (ref 0–7)
HCT: 31.6 % — ABNORMAL LOW (ref 35.0–47.0)
HGB: 10 g/dL — ABNORMAL LOW (ref 11.5–16.0)
IMMATURE GRANULOCYTES: 0 % (ref 0.0–0.5)
LYMPHOCYTES: 13 % (ref 12–49)
MCH: 28.5 PG (ref 26.0–34.0)
MCHC: 31.6 g/dL (ref 30.0–36.5)
MCV: 90 FL (ref 80.0–99.0)
MONOCYTES: 7 % (ref 5–13)
MPV: 10 FL (ref 8.9–12.9)
NEUTROPHILS: 78 % — ABNORMAL HIGH (ref 32–75)
NRBC: 0 PER 100 WBC
PLATELET: 244 10*3/uL (ref 150–400)
RBC: 3.51 M/uL — ABNORMAL LOW (ref 3.80–5.20)
RDW: 13.9 % (ref 11.5–14.5)
WBC: 8.8 10*3/uL (ref 3.6–11.0)

## 2017-12-12 LAB — METABOLIC PANEL, COMPREHENSIVE
A-G Ratio: 0.9 — ABNORMAL LOW (ref 1.1–2.2)
ALT (SGPT): 27 U/L (ref 12–78)
AST (SGOT): 27 U/L (ref 15–37)
Albumin: 3.6 g/dL (ref 3.5–5.0)
Alk. phosphatase: 107 U/L (ref 45–117)
Anion gap: 11 mmol/L (ref 5–15)
BUN/Creatinine ratio: 9 — ABNORMAL LOW (ref 12–20)
BUN: 19 MG/DL (ref 6–20)
Bilirubin, total: 0.5 MG/DL (ref 0.2–1.0)
CO2: 26 mmol/L (ref 21–32)
Calcium: 9.4 MG/DL (ref 8.5–10.1)
Chloride: 104 mmol/L (ref 97–108)
Creatinine: 2.01 MG/DL — ABNORMAL HIGH (ref 0.55–1.02)
GFR est AA: 28 mL/min/{1.73_m2} — ABNORMAL LOW (ref >60)
GFR est non-AA: 23 mL/min/{1.73_m2} — ABNORMAL LOW (ref >60)
Globulin: 4.2 g/dL — ABNORMAL HIGH (ref 2.0–4.0)
Glucose: 102 mg/dL — ABNORMAL HIGH (ref 65–100)
Potassium: 4.2 mmol/L (ref 3.5–5.1)
Protein, total: 7.8 g/dL (ref 6.4–8.2)
Sodium: 141 mmol/L (ref 136–145)

## 2017-12-12 LAB — URINALYSIS W/MICROSCOPIC
Bacteria: NEGATIVE /hpf
Bilirubin: NEGATIVE
Blood: NEGATIVE
Glucose: NEGATIVE mg/dL
Ketone: NEGATIVE mg/dL
Leukocyte Esterase: NEGATIVE
Nitrites: NEGATIVE
Protein: 300 mg/dL — AB
Specific gravity: 1.011 (ref 1.003–1.030)
Urobilinogen: 0.2 EU/dL (ref 0.2–1.0)
pH (UA): 7.5 (ref 5.0–8.0)

## 2017-12-12 LAB — URINE CULTURE HOLD SAMPLE

## 2017-12-12 MED ORDER — SODIUM CHLORIDE 0.9% BOLUS IV
0.9 % | Freq: Once | INTRAVENOUS | Status: AC
Start: 2017-12-12 — End: 2017-12-12
  Administered 2017-12-12: 15:00:00 via INTRAVENOUS

## 2017-12-12 MED ORDER — PROCHLORPERAZINE EDISYLATE 5 MG/ML INJECTION
5 mg/mL | INTRAMUSCULAR | Status: AC
Start: 2017-12-12 — End: 2017-12-12
  Administered 2017-12-12: 15:00:00 via INTRAVENOUS

## 2017-12-12 MED ORDER — DIPHENHYDRAMINE HCL 50 MG/ML IJ SOLN
50 mg/mL | INTRAMUSCULAR | Status: AC
Start: 2017-12-12 — End: 2017-12-12
  Administered 2017-12-12: 15:00:00 via INTRAVENOUS

## 2017-12-12 MED ORDER — ACETAMINOPHEN 500 MG TAB
500 mg | ORAL | Status: AC
Start: 2017-12-12 — End: 2017-12-12
  Administered 2017-12-12: 17:00:00 via ORAL

## 2017-12-12 MED FILL — SODIUM CHLORIDE 0.9 % IV: INTRAVENOUS | Qty: 500

## 2017-12-12 MED FILL — DIPHENHYDRAMINE HCL 50 MG/ML IJ SOLN: 50 mg/mL | INTRAMUSCULAR | Qty: 1

## 2017-12-12 MED FILL — PROCHLORPERAZINE EDISYLATE 5 MG/ML INJECTION: 5 mg/mL | INTRAMUSCULAR | Qty: 2

## 2017-12-12 MED FILL — ACETAMINOPHEN 500 MG TAB: 500 mg | ORAL | Qty: 2

## 2017-12-12 NOTE — ED Provider Notes (Signed)
83 y.o. female with past medical history significant for dementia, MI, colon ca, migraines, who presents from home with chief complaint of HA. Pt has onset last night of HA rated as 10/10. She has accompanying photophobia, n/v, and abd pain. Per daughter, pt is currently at her baseline mental status. No recent GLF. There are no other acute medical concerns at this time.     Full history, physical exam, and ROS unable to be obtained due to:  Poor historian.    Note written by Pearson GrippeSamuel Paek, Scribe, as dictated by Francoise CeoJones, Dickie Labarre S, DO 9:31 AM      The history is provided by the patient and a relative. History limited by: Poor historian. No language interpreter was used.        No past medical history on file.    No past surgical history on file.      No family history on file.    Social History     Socioeconomic History   ??? Marital status: WIDOWED     Spouse name: Not on file   ??? Number of children: Not on file   ??? Years of education: Not on file   ??? Highest education level: Not on file   Social Needs   ??? Financial resource strain: Not on file   ??? Food insecurity - worry: Not on file   ??? Food insecurity - inability: Not on file   ??? Transportation needs - medical: Not on file   ??? Transportation needs - non-medical: Not on file   Occupational History   ??? Not on file   Tobacco Use   ??? Smoking status: Not on file   Substance and Sexual Activity   ??? Alcohol use: Not on file   ??? Drug use: Not on file   ??? Sexual activity: Not on file   Other Topics Concern   ??? Not on file   Social History Narrative   ??? Not on file         ALLERGIES: Patient has no known allergies.    Review of Systems   Unable to perform ROS: Other       Vitals:    12/12/17 1115 12/12/17 1200 12/12/17 1215 12/12/17 1333   BP: 179/59 170/66 137/57 133/76   Pulse:  68  70   Resp:  18  18   Temp:  99.2 ??F (37.3 ??C)  99.1 ??F (37.3 ??C)   SpO2: 96% 96% 95% 98%   Weight:       Height:                Physical Exam    Constitutional: She is oriented to person, place, and time. She appears well-developed and well-nourished. No distress.   frail, appears uncomfortable   HENT:   Head: Normocephalic and atraumatic.   Mouth/Throat: Oropharynx is clear and moist.   Eyes: Conjunctivae and EOM are normal. Pupils are equal, round, and reactive to light.   Neck: Normal range of motion. Neck supple.   Cardiovascular: Normal rate, regular rhythm and normal heart sounds. Exam reveals no gallop and no friction rub.   No murmur heard.  Pulmonary/Chest: Effort normal and breath sounds normal. She has no wheezes. She has no rales.   Abdominal: Soft. She exhibits no distension. There is no tenderness. There is no rebound and no guarding.   Neurological: She is alert and oriented to person, place, and time. She has normal strength. No cranial nerve deficit. GCS eye subscore  is 4. GCS verbal subscore is 5. GCS motor subscore is 6.   shielding eyes from light. Fluent speech. Intact finger to nose. No pronator drift. 5/5 strength in all extremities.   Skin: Skin is warm and dry. She is not diaphoretic.   Nursing note and vitals reviewed.   Note written by Pearson GrippeSamuel Paek, Scribe, as dictated by No att. providers found 9:31 AM      MDM    83 y.o. female with hx of dementia prior migraines, presents with migraine like sx. Neuro intact at her baseline, no focal deficits.     Labs were drawn and returned showing Cr 2.01, no previous measures, but given age likely chronic. Hg 10.0, otherwise reassuring. UA neg. Given 500 cc bolus.     CT head show no acute abnormalities    Given a migraine cocktail with compazine, benadryl and tylenol. She was reassessed and found have resolved headache. Appears significantly better. Feel that this was a migraine headache given hx of and response to treatment. She was rec'd close f/u with her PCP for further evaluation, and strict return precautions were given for worsening or concerns. Discharged home with daughter.     Procedures

## 2017-12-12 NOTE — ED Notes (Signed)
Pt to ct at this time.

## 2017-12-12 NOTE — ED Notes (Signed)
The patient was discharged home by Dr.Jones in stable condition. The patient is alert and oriented, in no respiratory distress and discharge vital signs obtained. The patient's diagnosis, condition and treatment were explained. The patient expressed understanding. No prescriptions given. No work/school note given. A discharge plan has been developed. A case manager was not involved in the process. Aftercare instructions were given. Pt wheeled from ED and assisted into passenger seat of daughters vehicle. Seat belt securely fastened. All personal belongings and discharge papers in hand upon departure from ED.

## 2017-12-12 NOTE — ED Triage Notes (Addendum)
Headache and abd pain x this am. Denies n/v, chest pain, SOB. Bilateral low Back pain and burning w/ urination. Hx kidney disease. Poor historian

## 2017-12-12 NOTE — ED Notes (Signed)
Purposeful rounding completed. Toileting offered, pt reports that she does not have to use restroom at this time, ongoing plan of care discussed and pt???s concerns/questions addressed. Pain reassessed. Pt informed of time factors with lab/imaging study results. Pt resting on the stretcher in a position of comfort. Call bell within reach; will continue to monitor.

## 2018-01-11 ENCOUNTER — Emergency Department: Payer: Medicare Other

## 2018-01-11 ENCOUNTER — Other Ambulatory Visit: Payer: Self-pay

## 2018-01-11 ENCOUNTER — Emergency Department
Admission: EM | Admit: 2018-01-11 | Discharge: 2018-01-11 | Disposition: A | Discharge status: Home / Self Care | Payer: Medicare Other | Source: Home / Self Care | Attending: Emergency Medicine | Admitting: Emergency Medicine

## 2018-01-11 ENCOUNTER — Inpatient Hospital Stay
Admission: EM | Admit: 2018-01-11 | Discharge: 2018-01-13 | DRG: 439 | Disposition: A | Discharge status: Home Health | Payer: Medicare Other | Attending: Family Medicine | Admitting: Family Medicine

## 2018-01-11 ENCOUNTER — Encounter: Payer: Self-pay | Admitting: Emergency Medicine

## 2018-01-11 DIAGNOSIS — N179 Acute kidney failure, unspecified: Secondary | ICD-10-CM | POA: Diagnosis present

## 2018-01-11 DIAGNOSIS — R112 Nausea with vomiting, unspecified: Secondary | ICD-10-CM | POA: Insufficient documentation

## 2018-01-11 DIAGNOSIS — I2510 Atherosclerotic heart disease of native coronary artery without angina pectoris: Secondary | ICD-10-CM

## 2018-01-11 DIAGNOSIS — I10 Essential (primary) hypertension: Secondary | ICD-10-CM | POA: Diagnosis present

## 2018-01-11 DIAGNOSIS — Z79890 Hormone replacement therapy (postmenopausal): Secondary | ICD-10-CM | POA: Diagnosis not present

## 2018-01-11 DIAGNOSIS — I129 Hypertensive chronic kidney disease with stage 1 through stage 4 chronic kidney disease, or unspecified chronic kidney disease: Secondary | ICD-10-CM | POA: Insufficient documentation

## 2018-01-11 DIAGNOSIS — K219 Gastro-esophageal reflux disease without esophagitis: Secondary | ICD-10-CM | POA: Diagnosis not present

## 2018-01-11 DIAGNOSIS — E039 Hypothyroidism, unspecified: Secondary | ICD-10-CM | POA: Diagnosis present

## 2018-01-11 DIAGNOSIS — N189 Chronic kidney disease, unspecified: Secondary | ICD-10-CM

## 2018-01-11 DIAGNOSIS — K859 Acute pancreatitis without necrosis or infection, unspecified: Principal | ICD-10-CM | POA: Diagnosis present

## 2018-01-11 DIAGNOSIS — W19XXXA Unspecified fall, initial encounter: Secondary | ICD-10-CM | POA: Diagnosis present

## 2018-01-11 DIAGNOSIS — F0390 Unspecified dementia without behavioral disturbance: Secondary | ICD-10-CM | POA: Diagnosis not present

## 2018-01-11 DIAGNOSIS — M25512 Pain in left shoulder: Secondary | ICD-10-CM | POA: Diagnosis present

## 2018-01-11 LAB — COMPREHENSIVE METABOLIC PANEL
ALT: 16 U/L (ref 14–54)
AST: 36 U/L (ref 15–41)
Albumin: 3.9 g/dL (ref 3.5–5.0)
Alkaline Phosphatase: 91 U/L (ref 38–126)
Anion gap: 11 (ref 5–15)
BILIRUBIN TOTAL: 0.9 mg/dL (ref 0.3–1.2)
BUN: 24 mg/dL — AB (ref 6–20)
CO2: 22 mmol/L (ref 22–32)
CREATININE: 2.03 mg/dL — AB (ref 0.44–1.00)
Calcium: 9.3 mg/dL (ref 8.9–10.3)
Chloride: 104 mmol/L (ref 101–111)
GFR calc Af Amer: 23 mL/min — ABNORMAL LOW (ref >60)
GFR, EST NON AFRICAN AMERICAN: 20 mL/min — AB (ref >60)
Glucose, Bld: 125 mg/dL — ABNORMAL HIGH (ref 65–99)
Potassium: 4.3 mmol/L (ref 3.5–5.1)
Sodium: 137 mmol/L (ref 135–145)
TOTAL PROTEIN: 7.9 g/dL (ref 6.5–8.1)

## 2018-01-11 LAB — CBC WITH DIFFERENTIAL/PLATELET
Basophils Absolute: 0 10*3/uL (ref 0–0.1)
Basophils Relative: 1 %
EOS PCT: 4 %
Eosinophils Absolute: 0.2 10*3/uL (ref 0–0.7)
HCT: 31.7 % — ABNORMAL LOW (ref 35.0–47.0)
HEMOGLOBIN: 10.5 g/dL — AB (ref 12.0–16.0)
LYMPHS ABS: 1.9 10*3/uL (ref 1.0–3.6)
Lymphocytes Relative: 34 %
MCH: 29.1 pg (ref 26.0–34.0)
MCHC: 33.3 g/dL (ref 32.0–36.0)
MCV: 87.4 fL (ref 80.0–100.0)
Monocytes Absolute: 0.5 10*3/uL (ref 0.2–0.9)
Monocytes Relative: 8 %
Neutro Abs: 3 10*3/uL (ref 1.4–6.5)
Neutrophils Relative %: 53 %
PLATELETS: 221 10*3/uL (ref 150–440)
RBC: 3.62 MIL/uL — AB (ref 3.80–5.20)
RDW: 15.6 % — ABNORMAL HIGH (ref 11.5–14.5)
WBC: 5.7 10*3/uL (ref 3.6–11.0)

## 2018-01-11 LAB — LIPASE, BLOOD: Lipase: 84 U/L — ABNORMAL HIGH (ref 11–51)

## 2018-01-11 LAB — TROPONIN I

## 2018-01-11 MED ORDER — ONDANSETRON 4 MG PO TBDP: 4.0000 mg | ORAL_TABLET | Freq: Three times a day (TID) | ORAL | 0 refills | Status: AC | PRN

## 2018-01-11 MED ORDER — ONDANSETRON HCL 4 MG/2ML IJ SOLN
4.0000 mg | Freq: Once | INTRAMUSCULAR | Status: AC
  Administered 2018-01-11: 4 mg via INTRAVENOUS
  Filled 2018-01-11: qty 2

## 2018-01-11 NOTE — ED Triage Notes (Signed)
Per EMS, pt from [facility] where pt was seen here in this ED earlier this am from fall and emesis. EMS reports pt has left shoulder pain, pt guarding it at this time. EMS states pt was ambulatory on scene. EMS also reports staff at [facility] stated pt has had "increased confusion and gait has been off." Pt with hx of dementia per EMS. Pt able to answer questions at this time.

## 2018-01-11 NOTE — ED Notes (Signed)
Daughter called for an update on the patient.  [phone] her name is [place]

## 2018-01-11 NOTE — ED Notes (Addendum)
Pt has yellow arm band on, tennis shoes, high fall door sign on rim of door frame as well as bed alarm due to high fall risk.

## 2018-01-11 NOTE — ED Provider Notes (Signed)
[facility] Emergency Department Provider Note       Time seen: ----------------------------------------- 7:19 AM on 01/11/2018 ----------------------------------------- Level V caveat: History/ROS limited by dementia  I have reviewed the triage vital signs and the nursing notes.  HISTORY   Chief Complaint Fall and Emesis    HPI [name] [name] is a 84 y.o. female with a history of dementia, coronary artery disease, chronic kidney disease, colon cancer, GERD who presents to the ED for an unwitnessed fall.  She is from [facility] and brought in by EMS.  Patient was last seen her bed around 4 AM and then was found the floor next to her bed around 6 AM.  According to staff she vomited several times since the fall.  She does not remember the event.  Past Medical History:  Diagnosis Date  . CAD (coronary artery disease)   . Chronic kidney disease   . Colon cancer ([place])   . Dementia   . GERD (gastroesophageal reflux disease)   . Hypertension   . Hypothyroidism   . Iron deficiency anemia due to chronic blood loss 06/18/2015    Patient Active Problem List   Diagnosis Date Noted  . Anemia in chronic kidney disease 10/06/2016  . CKD (chronic kidney disease) 10/06/2016  . Iron deficiency anemia due to chronic blood loss 06/18/2015    Past Surgical History:  Procedure Laterality Date  . ABDOMINAL HYSTERECTOMY    . HEMICOLECTOMY      Allergies Patient has no known allergies.  Social History Social History   Tobacco Use  . Smoking status: Never Smoker  . Smokeless tobacco: Never Used  Substance Use Topics  . Alcohol use: No    Alcohol/week: 0.0 oz  . Drug use: No    Review of Systems Gastrointestinal: Positive for vomiting  All systems unknown except as stated in the HPI  ____________________________________________   PHYSICAL EXAM:  VITAL SIGNS: ED Triage Vitals  Enc Vitals Group     BP 01/11/18 0644 (!) 165/105     Pulse  Rate 01/11/18 0644 (!) 58     Resp 01/11/18 0644 18     Temp 01/11/18 0644 97.6 F (36.4 C)     Temp Source 01/11/18 0644 Oral     SpO2 01/11/18 0644 99 %     Weight 01/11/18 0647 130 lb (59 kg)     Height 01/11/18 0647 5\' 5"  (1.651 m)     Head Circumference --      Peak Flow --      Pain Score --      Pain Loc --      Pain Edu? --      Excl. in [place]? --     Constitutional: Alert but disoriented.  Well appearing and in no distress. Eyes: Conjunctivae are normal. Normal extraocular movements. ENT   Head: Normocephalic and atraumatic.   Nose: No congestion/rhinnorhea.   Mouth/Throat: Mucous membranes are moist.   Neck: No stridor. Cardiovascular: Normal rate, regular rhythm. No murmurs, rubs, or gallops. Respiratory: Normal respiratory effort without tachypnea nor retractions. Breath sounds are clear and equal bilaterally. No wheezes/rales/rhonchi. Gastrointestinal: Soft and nontender. Normal bowel sounds Musculoskeletal: Nontender with normal range of motion in extremities. No lower extremity tenderness nor edema. Neurologic:  Normal speech and language. No gross focal neurologic deficits are appreciated.  Skin:  Skin is warm, dry and intact. No rash noted. Psychiatric: Mood and affect are normal. Speech and behavior are normal.  ____________________________________________  EKG:  Interpreted by me.  Sinus rhythm the rate of 58 bpm, normal PR interval, left anterior fascicular block, possible inferior infarct age-indeterminate, LVH  ____________________________________________  ED COURSE:  As part of my medical decision making, I reviewed the following data within the [place] History obtained from family if available, nursing notes, old chart and ekg, as well as notes from prior ED visits. Patient presented for an unwitnessed fall, we will assess with labs and imaging as indicated at this time. Clinical Course as of Jan 11 921  Wed Jan 11, 2018   0806 Lipase: (!) 84 [JW]    Clinical Course User Index [JW] Earleen Newport, MD   Procedures ____________________________________________   LABS (pertinent positives/negatives)  Labs Reviewed  CBC WITH DIFFERENTIAL/PLATELET - Abnormal; Notable for the following components:      Result Value   RBC 3.62 (*)    Hemoglobin 10.5 (*)    HCT 31.7 (*)    RDW 15.6 (*)    All other components within normal limits  LIPASE, BLOOD - Abnormal; Notable for the following components:   Lipase 84 (*)    All other components within normal limits  COMPREHENSIVE METABOLIC PANEL - Abnormal; Notable for the following components:   Glucose, Bld 125 (*)    BUN 24 (*)    Creatinine, Ser 2.03 (*)    GFR calc non Af Amer 20 (*)    GFR calc Af Amer 23 (*)    All other components within normal limits  TROPONIN I  URINALYSIS, ROUTINE W REFLEX MICROSCOPIC    RADIOLOGY  Upper quadrant ultrasound IMPRESSION: Negative right upper quadrant ultrasound.   ____________________________________________  DIFFERENTIAL DIAGNOSIS   Fall, subdural hematoma, fracture, occult infection, dementia  FINAL ASSESSMENT AND PLAN  Fall, vomiting, mild pancreatitis   Plan: Patient had presented for an unwitnessed fall. Patient's labs did reveal surprisingly elevated lipase of uncertain etiology. Patient's imaging was negative.  She has no abdominal tenderness on examination and has not had further vomiting.  It is difficult to tell if this is viral or what the etiology is.  She will be discharged with antiemetics and is stable for outpatient follow-up.   Earleen Newport, MD   Note: This note was generated in part or whole with voice recognition software. Voice recognition is usually quite accurate but there are transcription errors that can and very often do occur. I apologize for any typographical errors that were not detected and corrected.     Earleen Newport, MD 01/11/18 [phone]

## 2018-01-11 NOTE — ED Provider Notes (Signed)
[facility] Emergency Department Provider Note   ____________________________________________   First MD Initiated Contact with Patient 01/11/18 2345     (approximate)  I have reviewed the triage vital signs and the nursing notes.   HISTORY  Chief Complaint Shoulder Pain (Left) and Altered Mental Status  Level 5 caveat: History limited by dementia  HPI [name] [name] is a 84 y.o. female brought to the ED from [facility] memory care unit via EMS with a chief complaint of left shoulder pain.  Patient was seen in the ED approximately 7 AM status post unwitnessed fall.  She was vomiting at the time and found to have elevated lipase of unknown etiology.  Had normal upper abdominal ultrasound.  States vomiting resolved.  Now complains of left shoulder pain "all day"; unable to move her shoulder.  EMS also reports staff at nursing facility stated patient has had increased confusion and unsteady gait.  Rest of history is unobtainable secondary to patient's dementia.   Past Medical History:  Diagnosis Date  . CAD (coronary artery disease)   . Chronic kidney disease   . Colon cancer ([place])   . Dementia   . GERD (gastroesophageal reflux disease)   . Hypertension   . Hypothyroidism   . Iron deficiency anemia due to chronic blood loss 06/18/2015    Patient Active Problem List   Diagnosis Date Noted  . Pancreatitis 01/12/2018  . Anemia in chronic kidney disease 10/06/2016  . CKD (chronic kidney disease) 10/06/2016  . Iron deficiency anemia due to chronic blood loss 06/18/2015    Past Surgical History:  Procedure Laterality Date  . ABDOMINAL HYSTERECTOMY    . HEMICOLECTOMY      Prior to Admission medications   Medication Sig Start Date End Date Taking? Authorizing Provider  acetaminophen (TYLENOL) 325 MG tablet Take 650 mg by mouth every 6 (six) hours as needed.   Yes [provider]  amLODipine (NORVASC) 10 MG tablet Take 10 mg by mouth daily.   04/18/17  Yes [provider]  atorvastatin (LIPITOR) 40 MG tablet Take 40 mg by mouth daily.  05/23/15  Yes [provider]  citalopram (CELEXA) 20 MG tablet Take 20 mg by mouth daily.  06/15/15  Yes [provider]  donepezil (ARICEPT) 10 MG tablet Take 10 mg by mouth at bedtime.  04/18/17  Yes [provider]  Fe Fum-FA-B Cmp-C-Zn-Mg-Mn-Cu (HEMOCYTE PLUS PO) Take 1 capsule by mouth daily.   Yes [provider]  Levothyroxine Sodium 50 MCG CAPS Take by mouth daily before breakfast.  05/23/15  Yes [provider]  Multiple Vitamins-Minerals (I-VITE) TABS Take 1 tablet by mouth daily.   Yes [provider]  omeprazole (PRILOSEC) 20 MG capsule Take 20 mg by mouth every other day.  06/14/15  Yes [provider]  ondansetron (ZOFRAN ODT) 4 MG disintegrating tablet Take 1 tablet (4 mg total) by mouth every 8 (eight) hours as needed for nausea or vomiting. 01/11/18  Yes Earleen Newport, MD  SALINE NASAL MIST NA Place 1 spray into the nose 2 (two) times daily.   Yes [provider]    Allergies Patient has no known allergies.  Family History  Problem Relation Age of Onset  . Cancer Mother     Social History Social History   Tobacco Use  . Smoking status: Never Smoker  . Smokeless tobacco: Never Used  Substance Use Topics  . Alcohol use: No    Alcohol/week: 0.0 oz  .  Drug use: No    Review of Systems  Constitutional: No fever/chills. Eyes: No visual changes. ENT: No sore throat. Cardiovascular: Denies chest pain. Respiratory: Denies shortness of breath. Gastrointestinal: No abdominal pain.  No nausea, no vomiting.  No diarrhea.  No constipation. Genitourinary: Negative for dysuria. Musculoskeletal: Positive for left shoulder pain.  Negative for back pain. Skin: Negative for rash. Neurological: Negative for headaches, focal weakness or  numbness.   ____________________________________________   PHYSICAL EXAM:  VITAL SIGNS: ED Triage Vitals  Enc Vitals Group     BP 01/11/18 2330 (!) 144/66     Pulse Rate 01/11/18 2330 62     Resp 01/11/18 2330 18     Temp 01/11/18 2330 98.1 F (36.7 C)     Temp Source 01/11/18 2330 Oral     SpO2 01/11/18 2330 99 %     Weight 01/11/18 2332 125 lb (56.7 kg)     Height 01/11/18 2332 5\' 4"  (1.626 m)     Head Circumference --      Peak Flow --      Pain Score 01/11/18 2331 6     Pain Loc --      Pain Edu? --      Excl. in [place]? --     Constitutional: Alert and oriented.  Frail appearing and in mild acute distress. Eyes: Conjunctivae are normal. PERRL. EOMI. Head: Atraumatic. Nose: No injury or deformity noted. Mouth/Throat: No dental malocclusion. Neck: No stridor.  No cervical spine tenderness to palpation. Cardiovascular: Normal rate, regular rhythm. Grossly normal heart sounds.  Good peripheral circulation. Respiratory: Normal respiratory effort.  No retractions. Lungs CTAB. Gastrointestinal: Soft and nontender to light or deep palpation. No distention. No abdominal bruits. No CVA tenderness. Musculoskeletal:  LUE: Patient holding left scapula for comfort.  No obvious deformity noted.  Posterior shoulder tender to palpation.  Limited range of motion secondary to pain.  Contusion noted to left olecranon.  2+ radial pulses.  Brisk, less than 5-second capillary refill. Neurologic: Alert and oriented to person and place.  Normal speech and language. No gross focal neurologic deficits are appreciated.  Skin:  Skin is warm, dry and intact. No rash noted. Psychiatric: Mood and affect are normal. Speech and behavior are normal.  ____________________________________________   LABS (all labs ordered are listed, but only abnormal results are displayed)  Labs Reviewed  CBC WITH DIFFERENTIAL/PLATELET - Abnormal; Notable for the following components:      Result Value   RBC 3.30 (*)     Hemoglobin 9.4 (*)    HCT 28.8 (*)    RDW 15.6 (*)    All other components within normal limits  COMPREHENSIVE METABOLIC PANEL - Abnormal; Notable for the following components:   CO2 21 (*)    BUN 26 (*)    Creatinine, Ser 2.17 (*)    GFR calc non Af Amer 19 (*)    GFR calc Af Amer 22 (*)    All other components within normal limits  LIPASE, BLOOD - Abnormal; Notable for the following components:   Lipase 660 (*)    All other components within normal limits  URINALYSIS, COMPLETE (UACMP) WITH MICROSCOPIC - Abnormal; Notable for the following components:   Color, Urine YELLOW (*)    APPearance CLEAR (*)    Protein, ur 100 (*)    Squamous Epithelial / LPF 0-5 (*)    All other components within normal limits  MRSA PCR SCREENING  TROPONIN I  HEMOGLOBIN A1C  ____________________________________________  EKG  ED ECG REPORT I, Aizlyn Schifano J, the attending physician, personally viewed and interpreted this ECG.   Date: 01/11/2018  EKG Time: 2328  Rate: 64  Rhythm: normal EKG, normal sinus rhythm  Axis: Normal  Intervals:none  ST&T Change: Nonspecific  ____________________________________________  RADIOLOGY  Dg Chest 1 View  Result Date: 01/12/2018 CLINICAL DATA:  Fall with pain EXAM: CHEST 1 VIEW COMPARISON:  05/30/2014 FINDINGS: Stable enlarged cardiomediastinal silhouette with vascular congestion. Aortic atherosclerosis. No focal consolidation or pleural effusion. No pneumothorax. IMPRESSION: Cardiomegaly with vascular congestion. Negative for pleural effusion or pneumothorax. Electronically Signed   By: Donavan Foil M.D.   On: 01/12/2018 00:45   Dg Lumbar Spine Complete  Result Date: 01/12/2018 CLINICAL DATA:  Fall with low back pain EXAM: LUMBAR SPINE - COMPLETE 4+ VIEW COMPARISON:  CT abdomen pelvis 09/04/2012 FINDINGS: Marked levoscoliosis of the lumbar spine. Dense aortic atherosclerosis. Mild retrolisthesis of L2 on L3. Disc space narrowing at L2-L3, L3-L4 and L4-L5.  The vertebral body heights are grossly maintained. IMPRESSION: Scoliosis and multiple level degenerative changes. No definite acute osseous abnormality is seen. Electronically Signed   By: Donavan Foil M.D.   On: 01/12/2018 02:26   Dg Elbow Complete Left  Result Date: 01/12/2018 CLINICAL DATA:  Fall with left arm pain EXAM: LEFT ELBOW - COMPLETE 3+ VIEW COMPARISON:  None. FINDINGS: Limited by positioning. Postsurgical or posttraumatic old appearing deformity of the radial head. Suspected elbow effusion. Prominent osteophytes posterior and anterior to the elbow. No acute fracture is seen IMPRESSION: No acute fracture is seen allowing for positioning. Old posttraumatic or postsurgical deformity of the radial head. Suspected elbow effusion. Electronically Signed   By: Donavan Foil M.D.   On: 01/12/2018 00:48   Dg Shoulder Left  Result Date: 01/12/2018 CLINICAL DATA:  Left arm pain after a fall EXAM: LEFT SHOULDER - 2+ VIEW COMPARISON:  Chest x-ray 05/30/2014 FINDINGS: Status post left shoulder replacement. No acute fracture or dislocation. Fixating screw tips along the proximal humerus with prominent lucency around the middle screw. AC joint is intact IMPRESSION: 1. Status post left shoulder replacement without definitive fracture or dislocation. 2. Prominent lucency around the middle screw tip at the proximal humerus, may reflect loosening. Electronically Signed   By: Donavan Foil M.D.   On: 01/12/2018 00:43   US Abdomen Limited Ruq  Result Date: 01/11/2018 CLINICAL DATA:  Vomiting.  Pancreatitis. EXAM: ULTRASOUND ABDOMEN LIMITED RIGHT UPPER QUADRANT COMPARISON:  Renal ultrasound 07/19/2014. CT of the abdomen and pelvis 09/04/2012 FINDINGS: Gallbladder: No gallstones or wall thickening visualized. No sonographic Murphy sign noted by sonographer. Common bile duct: Diameter: 4.1 mm, within normal limits. Liver: No focal lesion identified. Within normal limits in parenchymal echogenicity. Portal vein is  patent on color Doppler imaging with normal direction of blood flow towards the liver. IMPRESSION: Negative right upper quadrant ultrasound. Electronically Signed   By: San Morelle M.D.   On: 01/11/2018 08:58    ____________________________________________   PROCEDURES  Procedure(s) performed: None  Procedures  Critical Care performed: No  ____________________________________________   INITIAL IMPRESSION / ASSESSMENT AND PLAN / ED COURSE  As part of my medical decision making, I reviewed the following data within the [place] [facility] notes reviewed and incorporated, Labs reviewed, EKG interpreted, Old chart reviewed, Radiograph reviewed and Notes from prior ED visits.   84 year old female who presents with left shoulder pain; seen in the ED at 7 AM status post unwitnessed fall.  Facility also  reports patient with increased confusion and unsteady gait.  Differential diagnosis includes but is not limited to acute traumatic injury, encephalopathy, infectious, metabolic etiologies, etc.  Patient's lipase was elevated this morning and she was vomiting.  States vomiting has resolved.  Will repeat lab work, obtain urinalysis, imaging studies and reassess.  Clinical Course as of Jan 12 521  Thu Jan 12, 2018  0214 Patient's lipase noted to be significantly elevated from yesterday morning.  Patient's ultrasound was negative.  Will order additional IV fluids and discuss with hospitalist to evaluate patient in the emergency department for admission.  [JS]    Clinical Course User Index [JS] Paulette Blanch, MD     ____________________________________________   FINAL CLINICAL IMPRESSION(S) / ED DIAGNOSES  Final diagnoses:  Acute pancreatitis, unspecified complication status, unspecified pancreatitis type  Acute pain of left shoulder  AKI (acute kidney injury) [facility])     ED Discharge Orders    None       Note:  This document was prepared using Dragon  voice recognition software and may include unintentional dictation errors.    Paulette Blanch, MD 01/12/18 [phone]

## 2018-01-11 NOTE — ED Notes (Signed)
Pt transported to [facility] via ambulance. VSS. Pt is ambulatory to stretcher. No needs or concerns voiced.

## 2018-01-11 NOTE — ED Notes (Signed)
EMS  CALLED  FOR  TRANSPORT TO  [facility]

## 2018-01-11 NOTE — ED Notes (Signed)
Pt able to ambulate with walker and standby assist. Pt had slow steady gait.

## 2018-01-11 NOTE — ED Triage Notes (Signed)
Pt arrived from [facility] by EMS after a fall this morning.  Pt was last seen in her bed by staff at 0400 and was then found on the floor next to her bed around 0600. Staff report pt has vomited several times since fall. No obvious injury; pt denies pain. Hx of alzheimers. Unable to answer all triage questions approopriately at baseline. Pt humming quietly during triage with no increased work of breathing or distress noted.

## 2018-01-12 ENCOUNTER — Emergency Department: Payer: Medicare Other

## 2018-01-12 DIAGNOSIS — K219 Gastro-esophageal reflux disease without esophagitis: Secondary | ICD-10-CM | POA: Diagnosis not present

## 2018-01-12 DIAGNOSIS — Z79890 Hormone replacement therapy (postmenopausal): Secondary | ICD-10-CM | POA: Diagnosis not present

## 2018-01-12 DIAGNOSIS — K859 Acute pancreatitis without necrosis or infection, unspecified: Secondary | ICD-10-CM | POA: Diagnosis not present

## 2018-01-12 DIAGNOSIS — M25512 Pain in left shoulder: Secondary | ICD-10-CM | POA: Diagnosis present

## 2018-01-12 DIAGNOSIS — N179 Acute kidney failure, unspecified: Secondary | ICD-10-CM | POA: Diagnosis not present

## 2018-01-12 DIAGNOSIS — F0390 Unspecified dementia without behavioral disturbance: Secondary | ICD-10-CM | POA: Diagnosis not present

## 2018-01-12 DIAGNOSIS — I2510 Atherosclerotic heart disease of native coronary artery without angina pectoris: Secondary | ICD-10-CM | POA: Diagnosis not present

## 2018-01-12 DIAGNOSIS — E039 Hypothyroidism, unspecified: Secondary | ICD-10-CM | POA: Diagnosis not present

## 2018-01-12 DIAGNOSIS — I10 Essential (primary) hypertension: Secondary | ICD-10-CM | POA: Diagnosis not present

## 2018-01-12 DIAGNOSIS — W19XXXA Unspecified fall, initial encounter: Secondary | ICD-10-CM | POA: Diagnosis not present

## 2018-01-12 LAB — CBC WITH DIFFERENTIAL/PLATELET
BASOS ABS: 0.1 10*3/uL (ref 0–0.1)
BASOS PCT: 1 %
EOS PCT: 2 %
Eosinophils Absolute: 0.2 10*3/uL (ref 0–0.7)
HCT: 28.8 % — ABNORMAL LOW (ref 35.0–47.0)
Hemoglobin: 9.4 g/dL — ABNORMAL LOW (ref 12.0–16.0)
Lymphocytes Relative: 23 %
Lymphs Abs: 1.9 10*3/uL (ref 1.0–3.6)
MCH: 28.5 pg (ref 26.0–34.0)
MCHC: 32.7 g/dL (ref 32.0–36.0)
MCV: 87.1 fL (ref 80.0–100.0)
Monocytes Absolute: 0.8 10*3/uL (ref 0.2–0.9)
Monocytes Relative: 10 %
Neutro Abs: 5.3 10*3/uL (ref 1.4–6.5)
Neutrophils Relative %: 64 %
PLATELETS: 232 10*3/uL (ref 150–440)
RBC: 3.3 MIL/uL — ABNORMAL LOW (ref 3.80–5.20)
RDW: 15.6 % — AB (ref 11.5–14.5)
WBC: 8.3 10*3/uL (ref 3.6–11.0)

## 2018-01-12 LAB — COMPREHENSIVE METABOLIC PANEL
ALT: 16 U/L (ref 14–54)
AST: 34 U/L (ref 15–41)
Albumin: 4.1 g/dL (ref 3.5–5.0)
Alkaline Phosphatase: 82 U/L (ref 38–126)
Anion gap: 12 (ref 5–15)
BUN: 26 mg/dL — AB (ref 6–20)
CHLORIDE: 104 mmol/L (ref 101–111)
CO2: 21 mmol/L — ABNORMAL LOW (ref 22–32)
Calcium: 9.1 mg/dL (ref 8.9–10.3)
Creatinine, Ser: 2.17 mg/dL — ABNORMAL HIGH (ref 0.44–1.00)
GFR calc Af Amer: 22 mL/min — ABNORMAL LOW (ref >60)
GFR, EST NON AFRICAN AMERICAN: 19 mL/min — AB (ref >60)
Glucose, Bld: 99 mg/dL (ref 65–99)
POTASSIUM: 3.9 mmol/L (ref 3.5–5.1)
Sodium: 137 mmol/L (ref 135–145)
Total Bilirubin: 0.8 mg/dL (ref 0.3–1.2)
Total Protein: 7.3 g/dL (ref 6.5–8.1)

## 2018-01-12 LAB — URINALYSIS, COMPLETE (UACMP) WITH MICROSCOPIC
BACTERIA UA: NONE SEEN
Bilirubin Urine: NEGATIVE
GLUCOSE, UA: NEGATIVE mg/dL
HGB URINE DIPSTICK: NEGATIVE
Ketones, ur: NEGATIVE mg/dL
Leukocytes, UA: NEGATIVE
NITRITE: NEGATIVE
PH: 6 (ref 5.0–8.0)
Protein, ur: 100 mg/dL — AB
SPECIFIC GRAVITY, URINE: 1.016 (ref 1.005–1.030)

## 2018-01-12 LAB — LIPASE, BLOOD: LIPASE: 660 U/L — AB (ref 11–51)

## 2018-01-12 LAB — HEMOGLOBIN A1C
Hgb A1c MFr Bld: 5.2 % (ref 4.8–5.6)
Mean Plasma Glucose: 102.54 mg/dL

## 2018-01-12 LAB — TROPONIN I

## 2018-01-12 LAB — MRSA PCR SCREENING: MRSA by PCR: NEGATIVE

## 2018-01-12 MED ORDER — MORPHINE SULFATE (PF) 2 MG/ML IV SOLN: 1.0000 mg | INTRAVENOUS | Status: DC | PRN

## 2018-01-12 MED ORDER — HEPARIN SODIUM (PORCINE) 5000 UNIT/ML IJ SOLN
5000.0000 [IU] | Freq: Three times a day (TID) | INTRAMUSCULAR | Status: DC
  Administered 2018-01-12 – 2018-01-13 (×4): 5000 [IU] via SUBCUTANEOUS
  Filled 2018-01-12 (×4): qty 1

## 2018-01-12 MED ORDER — SODIUM CHLORIDE 0.9 % IV SOLN
INTRAVENOUS | Status: DC
  Administered 2018-01-12: 07:00:00 via INTRAVENOUS

## 2018-01-12 MED ORDER — SODIUM CHLORIDE 0.9 % IV BOLUS (SEPSIS)
1000.0000 mL | Freq: Once | INTRAVENOUS | Status: AC
  Administered 2018-01-12: 1000 mL via INTRAVENOUS

## 2018-01-12 MED ORDER — ONDANSETRON HCL 4 MG PO TABS: 4.0000 mg | ORAL_TABLET | Freq: Four times a day (QID) | ORAL | Status: DC | PRN

## 2018-01-12 MED ORDER — ACETAMINOPHEN 325 MG PO TABS
650.0000 mg | ORAL_TABLET | Freq: Four times a day (QID) | ORAL | Status: DC | PRN
  Administered 2018-01-12: 650 mg via ORAL
  Filled 2018-01-12 (×2): qty 2

## 2018-01-12 MED ORDER — ACETAMINOPHEN 325 MG PO TABS
650.0000 mg | ORAL_TABLET | Freq: Once | ORAL | Status: AC
  Administered 2018-01-12: 650 mg via ORAL
  Filled 2018-01-12: qty 2

## 2018-01-12 MED ORDER — ONDANSETRON HCL 4 MG/2ML IJ SOLN
4.0000 mg | Freq: Four times a day (QID) | INTRAMUSCULAR | Status: DC | PRN
Start: 2018-01-12 — End: 2018-01-13

## 2018-01-12 MED ORDER — ACETAMINOPHEN 650 MG RE SUPP: 650.0000 mg | Freq: Four times a day (QID) | RECTAL | Status: DC | PRN

## 2018-01-12 MED ORDER — DOCUSATE SODIUM 100 MG PO CAPS
100.0000 mg | ORAL_CAPSULE | Freq: Two times a day (BID) | ORAL | Status: DC
  Administered 2018-01-12 – 2018-01-13 (×2): 100 mg via ORAL
  Filled 2018-01-12 (×2): qty 1

## 2018-01-12 NOTE — ED Notes (Signed)
Pt reporting lower back pain, EDP notified, see new order.

## 2018-01-12 NOTE — ED Notes (Signed)
Patient transported to X-ray 

## 2018-01-12 NOTE — Consult Note (Signed)
ORTHOPAEDIC CONSULTATION  REQUESTING PHYSICIAN: Salary, Avel Peace, MD  Chief Complaint: left shoulder pain  HPI: [name] [name] is a 84 y.o. female who complains of left shoulder pain. She has minimal use of the left upper extremity after multiple surgeries. The pain is worse with any movement and better with rest. She has no constitutional symptoms. She is a poor historian with known dementia.  Past Medical History:  Diagnosis Date  . CAD (coronary artery disease)   . Chronic kidney disease   . Colon cancer ([place])   . Dementia   . GERD (gastroesophageal reflux disease)   . Hypertension   . Hypothyroidism   . Iron deficiency anemia due to chronic blood loss 06/18/2015   Past Surgical History:  Procedure Laterality Date  . ABDOMINAL HYSTERECTOMY    . HEMICOLECTOMY     Social History   Socioeconomic History  . Marital status: Widowed    Spouse name: None  . Number of children: None  . Years of education: None  . Highest education level: None  Social Needs  . Financial resource strain: None  . Food insecurity - worry: None  . Food insecurity - inability: None  . Transportation needs - medical: None  . Transportation needs - non-medical: None  Occupational History  . None  Tobacco Use  . Smoking status: Never Smoker  . Smokeless tobacco: Never Used  Substance and Sexual Activity  . Alcohol use: No    Alcohol/week: 0.0 oz  . Drug use: No  . Sexual activity: No  Other Topics Concern  . None  Social History Narrative  . None   Family History  Problem Relation Age of Onset  . Cancer Mother    No Known Allergies Prior to Admission medications   Medication Sig Start Date End Date Taking? Authorizing Provider  acetaminophen (TYLENOL) 325 MG tablet Take 650 mg by mouth every 6 (six) hours as needed.   Yes [provider]  amLODipine (NORVASC) 10 MG tablet Take 10 mg by mouth daily.  04/18/17  Yes [provider]  atorvastatin (LIPITOR) 40 MG tablet  Take 40 mg by mouth daily.  05/23/15  Yes [provider]  citalopram (CELEXA) 20 MG tablet Take 20 mg by mouth daily.  06/15/15  Yes [provider]  donepezil (ARICEPT) 10 MG tablet Take 10 mg by mouth at bedtime.  04/18/17  Yes [provider]  Fe Fum-FA-B Cmp-C-Zn-Mg-Mn-Cu (HEMOCYTE PLUS PO) Take 1 capsule by mouth daily.   Yes [provider]  Levothyroxine Sodium 50 MCG CAPS Take by mouth daily before breakfast.  05/23/15  Yes [provider]  Multiple Vitamins-Minerals (I-VITE) TABS Take 1 tablet by mouth daily.   Yes [provider]  omeprazole (PRILOSEC) 20 MG capsule Take 20 mg by mouth every other day.  06/14/15  Yes [provider]  ondansetron (ZOFRAN ODT) 4 MG disintegrating tablet Take 1 tablet (4 mg total) by mouth every 8 (eight) hours as needed for nausea or vomiting. 01/11/18  Yes Earleen Newport, MD  SALINE NASAL MIST NA Place 1 spray into the nose 2 (two) times daily.   Yes [provider]   Dg Chest 1 View  Result Date: 01/12/2018 CLINICAL DATA:  Fall with pain EXAM: CHEST 1 VIEW COMPARISON:  05/30/2014 FINDINGS: Stable enlarged cardiomediastinal silhouette with vascular congestion. Aortic atherosclerosis. No focal consolidation or pleural effusion. No pneumothorax. IMPRESSION: Cardiomegaly with vascular congestion. Negative for pleural effusion or pneumothorax. Electronically Signed   By:  Donavan Foil M.D.   On: 01/12/2018 00:45   Dg Lumbar Spine Complete  Result Date: 01/12/2018 CLINICAL DATA:  Fall with low back pain EXAM: LUMBAR SPINE - COMPLETE 4+ VIEW COMPARISON:  CT abdomen pelvis 09/04/2012 FINDINGS: Marked levoscoliosis of the lumbar spine. Dense aortic atherosclerosis. Mild retrolisthesis of L2 on L3. Disc space narrowing at L2-L3, L3-L4 and L4-L5. The vertebral body heights are grossly maintained. IMPRESSION: Scoliosis and multiple level degenerative changes. No definite acute osseous abnormality is  seen. Electronically Signed   By: Donavan Foil M.D.   On: 01/12/2018 02:26   Dg Elbow Complete Left  Result Date: 01/12/2018 CLINICAL DATA:  Fall with left arm pain EXAM: LEFT ELBOW - COMPLETE 3+ VIEW COMPARISON:  None. FINDINGS: Limited by positioning. Postsurgical or posttraumatic old appearing deformity of the radial head. Suspected elbow effusion. Prominent osteophytes posterior and anterior to the elbow. No acute fracture is seen IMPRESSION: No acute fracture is seen allowing for positioning. Old posttraumatic or postsurgical deformity of the radial head. Suspected elbow effusion. Electronically Signed   By: Donavan Foil M.D.   On: 01/12/2018 00:48   Dg Shoulder Left  Result Date: 01/12/2018 CLINICAL DATA:  Left arm pain after a fall EXAM: LEFT SHOULDER - 2+ VIEW COMPARISON:  Chest x-ray 05/30/2014 FINDINGS: Status post left shoulder replacement. No acute fracture or dislocation. Fixating screw tips along the proximal humerus with prominent lucency around the middle screw. AC joint is intact IMPRESSION: 1. Status post left shoulder replacement without definitive fracture or dislocation. 2. Prominent lucency around the middle screw tip at the proximal humerus, may reflect loosening. Electronically Signed   By: Donavan Foil M.D.   On: 01/12/2018 00:43   US Abdomen Limited Ruq  Result Date: 01/11/2018 CLINICAL DATA:  Vomiting.  Pancreatitis. EXAM: ULTRASOUND ABDOMEN LIMITED RIGHT UPPER QUADRANT COMPARISON:  Renal ultrasound 07/19/2014. CT of the abdomen and pelvis 09/04/2012 FINDINGS: Gallbladder: No gallstones or wall thickening visualized. No sonographic Murphy sign noted by sonographer. Common bile duct: Diameter: 4.1 mm, within normal limits. Liver: No focal lesion identified. Within normal limits in parenchymal echogenicity. Portal vein is patent on color Doppler imaging with normal direction of blood flow towards the liver. IMPRESSION: Negative right upper quadrant ultrasound. Electronically  Signed   By: San Morelle M.D.   On: 01/11/2018 08:58    Positive ROS: All other systems have been reviewed and were otherwise negative with the exception of those mentioned in the HPI and as above.  Physical Exam: General: Alert, no acute distress Cardiovascular: No pedal edema Respiratory: No cyanosis, no use of accessory musculature GI: No organomegaly, abdomen is soft and non-tender Skin: No lesions in the area of chief complaint Neurologic: Sensation intact distally Psychiatric: Patient is competent for consent with normal mood and affect Lymphatic: No axillary or cervical lymphadenopathy  MUSCULOSKELETAL: FROM RIGHT upper extremity, left shoulder with atrophy, mild tenderness, ROM 60 degrees of FF and 40 degrees of abduction. Minimal motion at the elbow. She is able to flex and extend her digits, 2+ radial pulse  X-ray of the LUE shows a shoulder hemiarthroplasty with glenoid erosion and 3 anchors. One anchor shows subsidence Elbow shows severe traumatic/post-surgical arthritis  Assessment: Left shoulder glenoid erosion, elbow has severe degenerative changes  Plan: Continue gentle ROM and strengthening of the left upper extremity. She is not a surgical candidate for revision surgery. She may follow-up as needed. Please call with questions.    Lovell Sheehan, MD    01/12/2018  12:37 PM

## 2018-01-12 NOTE — NC FL2 (Signed)
  [place] LEVEL OF CARE SCREENING TOOL     IDENTIFICATION  Patient Name: [name] [name] Birthdate: [birth year] Sex: female Admission Date (Current Location): 01/11/2018  [facility] [place] Number:  Engineering geologist and Address:  [facility], [address], [place], [place] [address]      Provider Number: [id number]  Attending Physician Name and Address:  Gorden Harms, MD  Relative Name and Phone Number:       Current Level of Care: Hospital Recommended Level of Care: [place] Prior Approval Number:    Date Approved/Denied:   PASRR Number: [id number] )  Discharge Plan: Domiciliary (Rest home)    Current Diagnoses: Patient Active Problem List   Diagnosis Date Noted  . Pancreatitis 01/12/2018  . Anemia in chronic kidney disease 10/06/2016  . CKD (chronic kidney disease) 10/06/2016  . Iron deficiency anemia due to chronic blood loss 06/18/2015    Orientation RESPIRATION BLADDER Height & Weight     Self, Place  Normal Incontinent Weight: 125 lb (56.7 kg) Height:  5\' 4"  (162.6 cm)  BEHAVIORAL SYMPTOMS/MOOD NEUROLOGICAL BOWEL NUTRITION STATUS      Incontinent Diet(Diet: clear liquid. )  AMBULATORY STATUS COMMUNICATION OF NEEDS Skin   Limited Assist Verbally Normal                       Personal Care Assistance Level of Assistance  Bathing, Feeding, Dressing Bathing Assistance: Limited assistance Feeding assistance: Independent Dressing Assistance: Limited assistance     Functional Limitations Info  Sight, Hearing, Speech Sight Info: Adequate Hearing Info: Adequate Speech Info: Adequate    SPECIAL CARE FACTORS FREQUENCY  PT (By licensed PT)     PT Frequency: (2-3 home health. )              Contractures      Additional Factors Info  Code Status, Allergies Code Status Info: (Full Code. ) Allergies Info: (No Known Allergies. )           Current Medications (01/12/2018):  This  is the current hospital active medication list Current Facility-Administered Medications  Medication Dose Route Frequency Provider Last Rate Last Dose  . acetaminophen (TYLENOL) tablet 650 mg  650 mg Oral Q6H PRN Harrie Foreman, MD       Or  . acetaminophen (TYLENOL) suppository 650 mg  650 mg Rectal Q6H PRN Harrie Foreman, MD      . docusate sodium (COLACE) capsule 100 mg  100 mg Oral BID Harrie Foreman, MD      . heparin injection 5,000 Units  5,000 Units Subcutaneous Q8H Harrie Foreman, MD   5,000 Units at 01/12/18 [phone]  . morphine 2 MG/ML injection 1-2 mg  1-2 mg Intravenous Q4H PRN Harrie Foreman, MD      . ondansetron [facility]) tablet 4 mg  4 mg Oral Q6H PRN Harrie Foreman, MD       Or  . ondansetron [facility]) injection 4 mg  4 mg Intravenous Q6H PRN Harrie Foreman, MD         Discharge Medications: Please see discharge summary for a list of discharge medications.  Relevant Imaging Results:  Relevant Lab Results:   Additional Information (SSN: [id number])  Viha Kriegel, Veronia Beets, LCSW

## 2018-01-12 NOTE — Progress Notes (Signed)
[place] [facility] [place] NAME: [name] [name]    MR#:  [id number]  DATE OF BIRTH:  04-Apr-1923  SUBJECTIVE:  CHIEF COMPLAINT:   Chief Complaint  Patient presents with  . Shoulder Pain    Left  . Altered Mental Status  Patient complains of left shoulder pain only, no abdominal pain, will advance diet as tolerated, orthopedic input appreciated, nursing staff concern for fall risks-order for sitter placed  REVIEW OF SYSTEMS:  CONSTITUTIONAL: No fever, fatigue or weakness.  EYES: No blurred or double vision.  EARS, NOSE, AND THROAT: No tinnitus or ear pain.  RESPIRATORY: No cough, shortness of breath, wheezing or hemoptysis.  CARDIOVASCULAR: No chest pain, orthopnea, edema.  GASTROINTESTINAL: No nausea, vomiting, diarrhea or abdominal pain.  GENITOURINARY: No dysuria, hematuria.  ENDOCRINE: No polyuria, nocturia,  HEMATOLOGY: No anemia, easy bruising or bleeding SKIN: No rash or lesion. MUSCULOSKELETAL: No joint pain or arthritis.   NEUROLOGIC: No tingling, numbness, weakness.  PSYCHIATRY: No anxiety or depression.   ROS  DRUG ALLERGIES:  No Known Allergies  VITALS:  Blood pressure (!) 160/99, pulse 74, temperature 98.1 F (36.7 C), temperature source Oral, resp. rate 18, height 5\' 4"  (1.626 m), weight 56.7 kg (125 lb), SpO2 96 %.  PHYSICAL EXAMINATION:  GENERAL:  84 y.o.-year-old patient lying in the bed with no acute distress.  Frail appearance EYES: Pupils equal, round, reactive to light and accommodation. No scleral icterus. Extraocular muscles intact.  HEENT: Head atraumatic, normocephalic. Oropharynx and nasopharynx clear.  NECK:  Supple, no jugular venous distention. No thyroid enlargement, no tenderness.  LUNGS: Normal breath sounds bilaterally, no wheezing, rales,rhonchi or crepitation. No use of accessory muscles of respiration.  CARDIOVASCULAR: S1, S2 normal. No murmurs, rubs, or gallops.  ABDOMEN: Soft, nontender, nondistended.  Bowel sounds present. No organomegaly or mass.  EXTREMITIES: No pedal edema, cyanosis, or clubbing.  Decreased range of motion of left shoulder secondary to pain, diffuse severe/extensive muscular atrophy NEUROLOGIC: Cranial nerves II through XII are intact. MAES. Gait not checked.  PSYCHIATRIC: The patient is alert and oriented x 2-3.  Poor memory SKIN: No obvious rash, lesion, or ulcer.   Physical Exam LABORATORY PANEL:   CBC Recent Labs  Lab 01/11/18 2348  WBC 8.3  HGB 9.4*  HCT 28.8*  PLT 232   ------------------------------------------------------------------------------------------------------------------  Chemistries  Recent Labs  Lab 01/11/18 2348  NA 137  K 3.9  CL 104  CO2 21*  GLUCOSE 99  BUN 26*  CREATININE 2.17*  CALCIUM 9.1  AST 34  ALT 16  ALKPHOS 82  BILITOT 0.8   ------------------------------------------------------------------------------------------------------------------  Cardiac Enzymes Recent Labs  Lab 01/11/18 0651 01/11/18 2348  TROPONINI <0.03 <0.03   ------------------------------------------------------------------------------------------------------------------  RADIOLOGY:  Dg Chest 1 View  Result Date: 01/12/2018 CLINICAL DATA:  Fall with pain EXAM: CHEST 1 VIEW COMPARISON:  05/30/2014 FINDINGS: Stable enlarged cardiomediastinal silhouette with vascular congestion. Aortic atherosclerosis. No focal consolidation or pleural effusion. No pneumothorax. IMPRESSION: Cardiomegaly with vascular congestion. Negative for pleural effusion or pneumothorax. Electronically Signed   By: Donavan Foil M.D.   On: 01/12/2018 00:45   Dg Lumbar Spine Complete  Result Date: 01/12/2018 CLINICAL DATA:  Fall with low back pain EXAM: LUMBAR SPINE - COMPLETE 4+ VIEW COMPARISON:  CT abdomen pelvis 09/04/2012 FINDINGS: Marked levoscoliosis of the lumbar spine. Dense aortic atherosclerosis. Mild retrolisthesis of L2 on L3. Disc space narrowing at L2-L3, L3-L4  and L4-L5. The vertebral body heights are grossly maintained. IMPRESSION: Scoliosis  and multiple level degenerative changes. No definite acute osseous abnormality is seen. Electronically Signed   By: Donavan Foil M.D.   On: 01/12/2018 02:26   Dg Elbow Complete Left  Result Date: 01/12/2018 CLINICAL DATA:  Fall with left arm pain EXAM: LEFT ELBOW - COMPLETE 3+ VIEW COMPARISON:  None. FINDINGS: Limited by positioning. Postsurgical or posttraumatic old appearing deformity of the radial head. Suspected elbow effusion. Prominent osteophytes posterior and anterior to the elbow. No acute fracture is seen IMPRESSION: No acute fracture is seen allowing for positioning. Old posttraumatic or postsurgical deformity of the radial head. Suspected elbow effusion. Electronically Signed   By: Donavan Foil M.D.   On: 01/12/2018 00:48   Dg Shoulder Left  Result Date: 01/12/2018 CLINICAL DATA:  Left arm pain after a fall EXAM: LEFT SHOULDER - 2+ VIEW COMPARISON:  Chest x-ray 05/30/2014 FINDINGS: Status post left shoulder replacement. No acute fracture or dislocation. Fixating screw tips along the proximal humerus with prominent lucency around the middle screw. AC joint is intact IMPRESSION: 1. Status post left shoulder replacement without definitive fracture or dislocation. 2. Prominent lucency around the middle screw tip at the proximal humerus, may reflect loosening. Electronically Signed   By: Donavan Foil M.D.   On: 01/12/2018 00:43   US Abdomen Limited Ruq  Result Date: 01/11/2018 CLINICAL DATA:  Vomiting.  Pancreatitis. EXAM: ULTRASOUND ABDOMEN LIMITED RIGHT UPPER QUADRANT COMPARISON:  Renal ultrasound 07/19/2014. CT of the abdomen and pelvis 09/04/2012 FINDINGS: Gallbladder: No gallstones or wall thickening visualized. No sonographic Murphy sign noted by sonographer. Common bile duct: Diameter: 4.1 mm, within normal limits. Liver: No focal lesion identified. Within normal limits in parenchymal echogenicity. Portal  vein is patent on color Doppler imaging with normal direction of blood flow towards the liver. IMPRESSION: Negative right upper quadrant ultrasound. Electronically Signed   By: San Morelle M.D.   On: 01/11/2018 08:58    ASSESSMENT AND PLAN:  This is a 84 year old female admitted for pancreatitis  1 acute pancreatitis Resolving Advance diet as tolerated, adult pain protocol as needed, antiemetics PRN, and continue close medical monitoring Abdominal ultrasound was unimpressive  2 acute left shoulder pain Status post fall Orthopedic surgery input appreciated given abnormal left shoulder x-ray Physical therapy to evaluate/treat Fall precautions  3 chronic benign essential hypertension  Stable on current regiment   4 chronic hypothyroidism, unspecified  Stable on Synthroid   5 chronic dementia  Stable  Continue psychotropic home regiment   All the records are reviewed and case discussed with Care Management/Social Workerr. Management plans discussed with the patient, family and they are in agreement.  CODE STATUS: full  TOTAL TIME TAKING CARE OF THIS PATIENT: 45 minutes.     POSSIBLE D/C IN 1-3 DAYS, DEPENDING ON CLINICAL CONDITION.   Avel Peace Dolores Ewing M.D on 01/12/2018   Between 7am to 6pm - Pager - [phone]  After 6pm go to [email or web address] - password [email or web address] [facility]  Office  [phone]  CC: Primary care physician; Patient, No Pcp Per  Note: This dictation was prepared with Dragon dictation along with smaller phrase technology. Any transcriptional errors that result from this process are unintentional.

## 2018-01-12 NOTE — H&P (Addendum)
[name] [name] is an 84 y.o. female.   Chief Complaint: Shoulder pain HPI: The patient with past medical history of hypertension, hypothyroidism, coronary artery disease and dementia presents to the emergency department complaining of left shoulder pain.  The patient had been seen in the emergency department 12 hours ago after a fall.  She was cleared for discharge at that time but became more confused at her nursing facility and had various musculoskeletal complaints.  Routine laboratory evaluation in the emergency department revealed elevated lipase consistent with pancreatitis which prompted the emergency department staff to call the hospitalist service for admission.  Past Medical History:  Diagnosis Date  . CAD (coronary artery disease)   . Chronic kidney disease   . Colon cancer ([place])   . Dementia   . GERD (gastroesophageal reflux disease)   . Hypertension   . Hypothyroidism   . Iron deficiency anemia due to chronic blood loss 06/18/2015    Past Surgical History:  Procedure Laterality Date  . ABDOMINAL HYSTERECTOMY    . HEMICOLECTOMY      Family History  Problem Relation Age of Onset  . Cancer Mother    Social History:  reports that  has never smoked. she has never used smokeless tobacco. She reports that she does not drink alcohol or use drugs.  Allergies: No Known Allergies  Medications Prior to Admission  Medication Sig Dispense Refill  . acetaminophen (TYLENOL) 325 MG tablet Take 650 mg by mouth every 6 (six) hours as needed.    Marland Kitchen amLODipine (NORVASC) 10 MG tablet Take 10 mg by mouth daily.     Marland Kitchen atorvastatin (LIPITOR) 40 MG tablet Take 40 mg by mouth daily.     . citalopram (CELEXA) 20 MG tablet Take 20 mg by mouth daily.     Marland Kitchen donepezil (ARICEPT) 10 MG tablet Take 10 mg by mouth at bedtime.     . Fe Fum-FA-B Cmp-C-Zn-Mg-Mn-Cu (HEMOCYTE PLUS PO) Take 1 capsule by mouth daily.    . Levothyroxine Sodium 50 MCG CAPS Take by mouth daily before breakfast.     . Multiple  Vitamins-Minerals (I-VITE) TABS Take 1 tablet by mouth daily.    Marland Kitchen omeprazole (PRILOSEC) 20 MG capsule Take 20 mg by mouth every other day.     . ondansetron (ZOFRAN ODT) 4 MG disintegrating tablet Take 1 tablet (4 mg total) by mouth every 8 (eight) hours as needed for nausea or vomiting. 20 tablet 0  . SALINE NASAL MIST NA Place 1 spray into the nose 2 (two) times daily.      Results for orders placed or performed during the hospital encounter of 01/11/18 (from the past 48 hour(s))  CBC with Differential     Status: Abnormal   Collection Time: 01/11/18 11:48 PM  Result Value Ref Range   WBC 8.3 3.6 - 11.0 K/uL   RBC 3.30 (L) 3.80 - 5.20 MIL/uL   Hemoglobin 9.4 (L) 12.0 - 16.0 g/dL   HCT 28.8 (L) 35.0 - 47.0 %   MCV 87.1 80.0 - 100.0 fL   MCH 28.5 26.0 - 34.0 pg   MCHC 32.7 32.0 - 36.0 g/dL   RDW 15.6 (H) 11.5 - 14.5 %   Platelets 232 150 - 440 K/uL   Neutrophils Relative % 64 %   Neutro Abs 5.3 1.4 - 6.5 K/uL   Lymphocytes Relative 23 %   Lymphs Abs 1.9 1.0 - 3.6 K/uL   Monocytes Relative 10 %   Monocytes Absolute 0.8 0.2 - 0.9  K/uL   Eosinophils Relative 2 %   Eosinophils Absolute 0.2 0 - 0.7 K/uL   Basophils Relative 1 %   Basophils Absolute 0.1 0 - 0.1 K/uL    Comment: Performed at [facility], [address] [place], [place] [address]  Comprehensive metabolic panel     Status: Abnormal   Collection Time: 01/11/18 11:48 PM  Result Value Ref Range   Sodium 137 135 - 145 mmol/L   Potassium 3.9 3.5 - 5.1 mmol/L   Chloride 104 101 - 111 mmol/L   CO2 21 (L) 22 - 32 mmol/L   Glucose, Bld 99 65 - 99 mg/dL   BUN 26 (H) 6 - 20 mg/dL   Creatinine, Ser 2.17 (H) 0.44 - 1.00 mg/dL   Calcium 9.1 8.9 - 10.3 mg/dL   Total Protein 7.3 6.5 - 8.1 g/dL   Albumin 4.1 3.5 - 5.0 g/dL   AST 34 15 - 41 U/L   ALT 16 14 - 54 U/L   Alkaline Phosphatase 82 38 - 126 U/L   Total Bilirubin 0.8 0.3 - 1.2 mg/dL   GFR calc non Af Amer 19 (L) >60 mL/min   GFR calc Af Amer 22 (L) >60  mL/min    Comment: (NOTE) The eGFR has been calculated using the CKD EPI equation. This calculation has not been validated in all clinical situations. eGFR's persistently <60 mL/min signify possible Chronic Kidney Disease.    Anion gap 12 5 - 15    Comment: Performed at [facility], [address] [place], [place] [address]  Lipase, blood     Status: Abnormal   Collection Time: 01/11/18 11:48 PM  Result Value Ref Range   Lipase 660 (H) 11 - 51 U/L    Comment: RESULT CONFIRMED BY MANUAL DILUTION ALV Performed at [facility] [address] [place], [place] [address]   Troponin I     Status: None   Collection Time: 01/11/18 11:48 PM  Result Value Ref Range   Troponin I <0.03 <0.03 ng/mL    Comment: Performed at [facility], [address] [place], [place] [address]  Urinalysis, Complete w Microscopic     Status: Abnormal   Collection Time: 01/11/18 11:48 PM  Result Value Ref Range   Color, Urine YELLOW (A) YELLOW   APPearance CLEAR (A) CLEAR   Specific Gravity, Urine 1.016 1.005 - 1.030   pH 6.0 5.0 - 8.0   Glucose, UA NEGATIVE NEGATIVE mg/dL   Hgb urine dipstick NEGATIVE NEGATIVE   Bilirubin Urine NEGATIVE NEGATIVE   Ketones, ur NEGATIVE NEGATIVE mg/dL   Protein, ur 100 (A) NEGATIVE mg/dL   Nitrite NEGATIVE NEGATIVE   Leukocytes, UA NEGATIVE NEGATIVE   RBC / HPF 0-5 0 - 5 RBC/hpf   WBC, UA 0-5 0 - 5 WBC/hpf   Bacteria, UA NONE SEEN NONE SEEN   Squamous Epithelial / LPF 0-5 (A) NONE SEEN   Mucus PRESENT     Comment: Performed at [facility], [address] [place], [place] [address]  MRSA PCR Screening     Status: None   Collection Time: 01/12/18  4:45 AM  Result Value Ref Range   MRSA by PCR NEGATIVE NEGATIVE    Comment:        The GeneXpert MRSA Assay (FDA approved for NASAL specimens only), is one component of a comprehensive MRSA colonization surveillance program. It is not intended to diagnose MRSA infection nor to  guide or monitor treatment for  MRSA infections. Performed at [facility], [address] [place], [place] [address]    Dg Chest 1 View  Result Date: 01/12/2018 CLINICAL DATA:  Fall with pain EXAM: CHEST 1 VIEW COMPARISON:  05/30/2014 FINDINGS: Stable enlarged cardiomediastinal silhouette with vascular congestion. Aortic atherosclerosis. No focal consolidation or pleural effusion. No pneumothorax. IMPRESSION: Cardiomegaly with vascular congestion. Negative for pleural effusion or pneumothorax. Electronically Signed   By: Donavan Foil M.D.   On: 01/12/2018 00:45   Dg Lumbar Spine Complete  Result Date: 01/12/2018 CLINICAL DATA:  Fall with low back pain EXAM: LUMBAR SPINE - COMPLETE 4+ VIEW COMPARISON:  CT abdomen pelvis 09/04/2012 FINDINGS: Marked levoscoliosis of the lumbar spine. Dense aortic atherosclerosis. Mild retrolisthesis of L2 on L3. Disc space narrowing at L2-L3, L3-L4 and L4-L5. The vertebral body heights are grossly maintained. IMPRESSION: Scoliosis and multiple level degenerative changes. No definite acute osseous abnormality is seen. Electronically Signed   By: Donavan Foil M.D.   On: 01/12/2018 02:26   Dg Elbow Complete Left  Result Date: 01/12/2018 CLINICAL DATA:  Fall with left arm pain EXAM: LEFT ELBOW - COMPLETE 3+ VIEW COMPARISON:  None. FINDINGS: Limited by positioning. Postsurgical or posttraumatic old appearing deformity of the radial head. Suspected elbow effusion. Prominent osteophytes posterior and anterior to the elbow. No acute fracture is seen IMPRESSION: No acute fracture is seen allowing for positioning. Old posttraumatic or postsurgical deformity of the radial head. Suspected elbow effusion. Electronically Signed   By: Donavan Foil M.D.   On: 01/12/2018 00:48   Dg Shoulder Left  Result Date: 01/12/2018 CLINICAL DATA:  Left arm pain after a fall EXAM: LEFT SHOULDER - 2+ VIEW COMPARISON:  Chest x-ray 05/30/2014 FINDINGS: Status post left shoulder  replacement. No acute fracture or dislocation. Fixating screw tips along the proximal humerus with prominent lucency around the middle screw. AC joint is intact IMPRESSION: 1. Status post left shoulder replacement without definitive fracture or dislocation. 2. Prominent lucency around the middle screw tip at the proximal humerus, may reflect loosening. Electronically Signed   By: Donavan Foil M.D.   On: 01/12/2018 00:43   US Abdomen Limited Ruq  Result Date: 01/11/2018 CLINICAL DATA:  Vomiting.  Pancreatitis. EXAM: ULTRASOUND ABDOMEN LIMITED RIGHT UPPER QUADRANT COMPARISON:  Renal ultrasound 07/19/2014. CT of the abdomen and pelvis 09/04/2012 FINDINGS: Gallbladder: No gallstones or wall thickening visualized. No sonographic Murphy sign noted by sonographer. Common bile duct: Diameter: 4.1 mm, within normal limits. Liver: No focal lesion identified. Within normal limits in parenchymal echogenicity. Portal vein is patent on color Doppler imaging with normal direction of blood flow towards the liver. IMPRESSION: Negative right upper quadrant ultrasound. Electronically Signed   By: San Morelle M.D.   On: 01/11/2018 08:58    Review of Systems  Unable to perform ROS: Dementia  Musculoskeletal: Positive for joint pain (Shoulder pain).    Blood pressure (!) 160/99, pulse 74, temperature 98.1 F (36.7 C), temperature source Oral, resp. rate 18, height 5' 4"  (1.626 m), weight 56.7 kg (125 lb), SpO2 96 %. Physical Exam  Vitals reviewed. Constitutional: She is oriented to person, place, and time. She appears well-developed and well-nourished.  HENT:  Head: Normocephalic and atraumatic.  Mouth/Throat: Oropharynx is clear and moist.  Eyes: Conjunctivae and EOM are normal. Pupils are equal, round, and reactive to light. No scleral icterus.  Neck: Normal range of motion. Neck supple. No JVD present. No tracheal deviation present. No thyromegaly present.  Cardiovascular: Normal rate, regular rhythm  and normal heart sounds. Exam reveals no gallop and no friction rub.  No murmur heard. Respiratory: Effort normal and breath sounds normal.  GI: Soft. Bowel sounds are normal. She exhibits no distension and no mass. There is tenderness. There is no rebound and no guarding.  Genitourinary:  Genitourinary Comments: Deferred  Musculoskeletal: Normal range of motion. She exhibits no edema.  Lymphadenopathy:    She has no cervical adenopathy.  Neurological: She is alert and oriented to person, place, and time. No cranial nerve deficit. She exhibits normal muscle tone.  Skin: Skin is warm and dry. No rash noted. No erythema.  Psychiatric: She has a normal mood and affect. Her behavior is normal. Judgment and thought content normal.     Assessment/Plan This is a 84 year old female admitted for pancreatitis. 1.  Pancreatitis: Elevated lipase; no nausea vomiting.  Patient is n.p.o. for now.  Treat with intravenous fluid.  Advance diet as tolerated.  No signs or symptoms of sepsis. 2.  Hypertension: Controlled; resume home medications as patient is able to take p.o. 3.  Hypothyroidism: Check TSH; continue Synthroid 4.  Dementia: Continue Aricept and Celexa 5.  DVT prophylaxis: Heparin 6.  GI prophylaxis: None The patient is a full code.  Time spent on admission orders and patient care approximately 45 minutes  Harrie Foreman, MD 01/12/2018, 7:26 AM

## 2018-01-12 NOTE — Clinical Social Work Note (Signed)
Clinical Social Work Assessment  Patient Details  Name: [name] [name] MRN: [id number] Date of Birth: Sep 05, 1923  Date of referral:  01/12/18               Reason for consult:  Other (Comment Required)(From [facility] )                Permission sought to share information with:  Chartered certified accountant granted to share information::  Yes, Verbal Permission Granted  Name::      [facility]   Agency::     Relationship::     Contact Information:     Housing/Transportation Living arrangements for the past 2 months:  [place] of Information:  Adult Children, Facility Patient Interpreter Needed:  None Criminal Activity/Legal Involvement Pertinent to Current Situation/Hospitalization:  No - Comment as needed Significant Relationships:  Adult Children Lives with:  Facility Resident Do you feel safe going back to the place where you live?  Yes Need for family participation in patient care:  Yes (Comment)  Care giving concerns:  Patient has been a resident at [facility] in [place] since 2015 (fax: [phone]).    Social Worker assessment / plan:  Holiday representative (CSW) reviewed chart and noted that patient is from [facility]. CSW contacted Estate agent at [facility] to get additional information. Per Lattie Haw patient has been a resident since 2015 on the ALF side. Per Lattie Haw patient walks with a walker at baseline and is independent with her ADLs. Per Lattie Haw patient is on room air at baseline and her daughter [name] [name] is her POA. Per Lattie Haw patient can return to [facility] when stable. Per chart patient is not alert and oriented so CSW contacted her daughter [name] [name]. Per daughter she lives in [place] and is patient's HPOA and handle her finances. Per Belinda patient had shoulder surgery at [facility] several years ago. Per [name] [name] she is not sure surgery would be the best option because of patient's dementia. Per [name] [name] she prefers  for patient to return back to [facility]. CSW will continue to follow and assist as needed.    Employment status:  Retired, Disabled (Comment on whether or not currently receiving Disability) Insurance information:  Medicare PT Recommendations:  Not assessed at this time Information / Referral to community resources:  Other (Comment Required)(Patient will return to [facility] )  Patient/Family's Response to care:  Patient's daughter prefers for patient to return to [facility].   Patient/Family's Understanding of and Emotional Response to Diagnosis, Current Treatment, and Prognosis:  Patient's daughter was very pleasant and thanked CSW for assistance.   Emotional Assessment Appearance:  Appears stated age Attitude/Demeanor/Rapport:  Unable to Assess Affect (typically observed):  Pleasant Orientation:  Oriented to Self, Oriented to Place, Fluctuating Orientation (Suspected and/or reported Sundowners) Alcohol / Substance use:  Not Applicable Psych involvement (Current and /or in the community):  No (Comment)  Discharge Needs  Concerns to be addressed:  Discharge Planning Concerns Readmission within the last 30 days:  No Current discharge risk:  Cognitively Impaired Barriers to Discharge:  Continued Medical Work up   [employer], Veronia Beets, LCSW 01/12/2018, 9:45 AM

## 2018-01-13 DIAGNOSIS — M25512 Pain in left shoulder: Secondary | ICD-10-CM | POA: Diagnosis not present

## 2018-01-13 DIAGNOSIS — K859 Acute pancreatitis without necrosis or infection, unspecified: Secondary | ICD-10-CM | POA: Diagnosis not present

## 2018-01-13 MED ORDER — LEVOTHYROXINE SODIUM 50 MCG PO TABS
ORAL_TABLET | Freq: Every day | ORAL | Status: DC
  Administered 2018-01-13: 50 ug via ORAL
  Filled 2018-01-13: qty 1

## 2018-01-13 MED ORDER — DONEPEZIL HCL 5 MG PO TABS
10.0000 mg | ORAL_TABLET | Freq: Every day | ORAL | Status: DC
  Filled 2018-01-13: qty 2

## 2018-01-13 MED ORDER — AMLODIPINE BESYLATE 10 MG PO TABS
10.0000 mg | ORAL_TABLET | Freq: Every day | ORAL | Status: DC
  Administered 2018-01-13: 10 mg via ORAL
  Filled 2018-01-13: qty 1

## 2018-01-13 MED ORDER — PANTOPRAZOLE SODIUM 40 MG PO TBEC
40.0000 mg | DELAYED_RELEASE_TABLET | Freq: Every day | ORAL | Status: DC
  Administered 2018-01-13: 40 mg via ORAL
  Filled 2018-01-13: qty 1

## 2018-01-13 NOTE — Progress Notes (Signed)
RN attempted to call report to [facility] twice. Was hung up on each time. Will continue trying.

## 2018-01-13 NOTE — Progress Notes (Signed)
Pt in no acute distress. Anxious to get back to [facility]. Report called to facility. IV removed. VSS. Pt wheeled to visitors entrance and assisted into [facility] facility transport vehicle by RN.

## 2018-01-13 NOTE — Progress Notes (Signed)
Patient is medically stable for D/C back to [facility]. Per Lattie Haw clinical coordinator at [facility] patient can return today and their transport employee Yvone Neu will come pick her up before 1 pm today. Clinical Education officer, museum (CSW) made Lattie Haw aware that patient had a tele-sitter this morning because she keeps trying to get up and is ready to go home. Per Lattie Haw patient is not in a familiar with environment and that is why she is getting confused, getting up and trying to leave. Per Lattie Haw patient can return to [facility] with home health PT. RN case manager arranged home health. CSW sent D/C summary and FL2 to [facility]. RN will call report. Patient's daughter Marliss Coots is aware of above. Please reconsult if future social work needs arise. CSW signing off.   [employer], LCSW [phone]

## 2018-01-13 NOTE — Care Management Note (Signed)
Case Management Note  Patient Details  Name: [name] [name] MRN: [id number] Date of Birth: 1923-05-26  Subjective/Objective:   Patient returning to [facility] today.                  Action/Plan: Referral to Clarise Cruz with  [facility] home health for PT.   Expected Discharge Date:  01/13/18               Expected Discharge Plan:  [place]  In-House Referral:     Discharge planning Services  CM Consult  Post Acute Care Choice:  Home Health Choice offered to:     DME Arranged:    DME Agency:     HH Arranged:  PT HH Agency:  ([facility])  Status of Service:  Completed, signed off  If discussed at [place] of Stay Meetings, dates discussed:    Additional Comments:  Jolly Mango, RN 01/13/2018, 11:03 AM

## 2018-01-13 NOTE — NC FL2 (Signed)
  [place] LEVEL OF CARE SCREENING TOOL     IDENTIFICATION  Patient Name: [name] [name] Birthdate: 03-24-1923 Sex: female Admission Date (Current Location): 01/11/2018  [place] and [place] Number:  Engineering geologist and Address:  [facility], [address] [place], [place] [address]      Provider Number: [id number]  Attending Physician Name and Address:  Gorden Harms, MD  Relative Name and Phone Number:       Current Level of Care: Hospital Recommended Level of Care: [place] Prior Approval Number:    Date Approved/Denied:   PASRR Number:  Discharge Plan: Domiciliary (Rest home)    Current Diagnoses: Patient Active Problem List   Diagnosis Date Noted  . Pancreatitis 01/12/2018  . Anemia in chronic kidney disease 10/06/2016  . CKD (chronic kidney disease) 10/06/2016  . Iron deficiency anemia due to chronic blood loss 06/18/2015    Orientation RESPIRATION BLADDER Height & Weight     Self, Place  Normal Incontinent Weight: 125 lb (56.7 kg) Height:  5\' 4"  (162.6 cm)  BEHAVIORAL SYMPTOMS/MOOD NEUROLOGICAL BOWEL NUTRITION STATUS      Incontinent Diet: Heart Healthy   AMBULATORY STATUS COMMUNICATION OF NEEDS Skin   Limited Assist Verbally Normal                       Personal Care Assistance Level of Assistance  Bathing, Feeding, Dressing Bathing Assistance: Limited assistance Feeding assistance: Independent Dressing Assistance: Limited assistance     Functional Limitations Info  Sight, Hearing, Speech Sight Info: Adequate Hearing Info: Adequate Speech Info: Adequate    SPECIAL CARE FACTORS FREQUENCY  PT (By licensed PT)     PT Frequency: (2-3 home health. )              Contractures      Additional Factors Info  Code Status, Allergies Code Status Info: (Full Code. ) Allergies Info: (No Known Allergies. )          Discharge Medications: Please see discharge summary for a  list of discharge medications. Medication List     TAKE these medications   acetaminophen 325 MG tablet Commonly known as:  TYLENOL Take 650 mg by mouth every 6 (six) hours as needed.   amLODipine 10 MG tablet Commonly known as:  NORVASC Take 10 mg by mouth daily.   atorvastatin 40 MG tablet Commonly known as:  LIPITOR Take 40 mg by mouth daily.   citalopram 20 MG tablet Commonly known as:  CELEXA Take 20 mg by mouth daily.   donepezil 10 MG tablet Commonly known as:  ARICEPT Take 10 mg by mouth at bedtime.   HEMOCYTE PLUS PO Take 1 capsule by mouth daily.   I-VITE Tabs Take 1 tablet by mouth daily.   Levothyroxine Sodium 50 MCG Caps Take by mouth daily before breakfast.   omeprazole 20 MG capsule Commonly known as:  PRILOSEC Take 20 mg by mouth every other day.   ondansetron 4 MG disintegrating tablet Commonly known as:  ZOFRAN ODT Take 1 tablet (4 mg total) by mouth every 8 (eight) hours as needed for nausea or vomiting.   SALINE NASAL MIST NA Place 1 spray into the nose 2 (two) times daily.     Relevant Imaging Results: Relevant Lab Results: Additional Information (SSN: [id number])  Makaela Cando, Veronia Beets, LCSW

## 2018-01-13 NOTE — Progress Notes (Signed)
Pt very agitated this am. Trying to leave the room. Will not stay in chair or on bed. Notified pts daughter and paged md for assitance. Pt is a fall risk.

## 2018-01-13 NOTE — Discharge Summary (Signed)
[facility] [place] NAME: [name] [name]    MR#:  [id number]  DATE OF BIRTH:  1923/01/13  DATE OF ADMISSION:  01/11/2018 ADMITTING PHYSICIAN: Harrie Foreman, MD  DATE OF DISCHARGE: No discharge date for patient encounter.  PRIMARY CARE PHYSICIAN: Patient, No Pcp Per    ADMISSION DIAGNOSIS:  Fall [W19.XXXA] AKI (acute kidney injury) ([place]) [N17.9] Acute pain of left shoulder [M25.512] Acute pancreatitis, unspecified complication status, unspecified pancreatitis type [K85.90]  DISCHARGE DIAGNOSIS:  Active Problems:   Pancreatitis   SECONDARY DIAGNOSIS:   Past Medical History:  Diagnosis Date  . CAD (coronary artery disease)   . Chronic kidney disease   . Colon cancer ([place])   . Dementia   . GERD (gastroesophageal reflux disease)   . Hypertension   . Hypothyroidism   . Iron deficiency anemia due to chronic blood loss 06/18/2015    HOSPITAL COURSE:  This is a 84 year old female admitted for pancreatitis  1 acute pancreatitis Resolved Tolerated diet, provided antiemetics PRN, and abdominal ultrasound was unimpressive  2 acute left shoulder pain Status post fall Orthopedic surgery to see patient while in house given abnormal left shoulder x-ray-no intervention indicated Physical therapy did see patient while in house Fall precautions Will have health PT status post discharge  3 chronic benign essential hypertension  Stable on current regiment   4 chronic hypothyroidism, unspecified  Stable on Synthroid   5 chronic dementia  Stable  Continued psychotropic home regiment   DISCHARGE CONDITIONS:  On the day of discharge patient is afebrile, hemodynamically stable, tolerating diet, ready for discharge to facility, follow with primary care provider in 3-5 days for reevaluation, for more specific details please see chart  CONSULTS OBTAINED:  Treatment Team:  Lovell Sheehan, MD  DRUG ALLERGIES:  No Known  Allergies  DISCHARGE MEDICATIONS:   Allergies as of 01/13/2018   No Known Allergies     Medication List    TAKE these medications   acetaminophen 325 MG tablet Commonly known as:  TYLENOL Take 650 mg by mouth every 6 (six) hours as needed.   amLODipine 10 MG tablet Commonly known as:  NORVASC Take 10 mg by mouth daily.   atorvastatin 40 MG tablet Commonly known as:  LIPITOR Take 40 mg by mouth daily.   citalopram 20 MG tablet Commonly known as:  CELEXA Take 20 mg by mouth daily.   donepezil 10 MG tablet Commonly known as:  ARICEPT Take 10 mg by mouth at bedtime.   HEMOCYTE PLUS PO Take 1 capsule by mouth daily.   I-VITE Tabs Take 1 tablet by mouth daily.   Levothyroxine Sodium 50 MCG Caps Take by mouth daily before breakfast.   omeprazole 20 MG capsule Commonly known as:  PRILOSEC Take 20 mg by mouth every other day.   ondansetron 4 MG disintegrating tablet Commonly known as:  ZOFRAN ODT Take 1 tablet (4 mg total) by mouth every 8 (eight) hours as needed for nausea or vomiting.   SALINE NASAL MIST NA Place 1 spray into the nose 2 (two) times daily.        DISCHARGE INSTRUCTIONS:   If you experience worsening of your admission symptoms, develop shortness of breath, life threatening emergency, suicidal or homicidal thoughts you must seek medical attention immediately by calling 911 or calling your MD immediately  if symptoms less severe.  You Must read complete instructions/literature along with all the possible adverse reactions/side effects for all the  Medicines you take and that have been prescribed to you. Take any new Medicines after you have completely understood and accept all the possible adverse reactions/side effects.   Please note  You were cared for by a hospitalist during your hospital stay. If you have any questions about your discharge medications or the care you received while you were in the hospital after you are discharged, you can call  the unit and asked to speak with the hospitalist on call if the hospitalist that took care of you is not available. Once you are discharged, your primary care physician will handle any further medical issues. Please note that NO REFILLS for any discharge medications will be authorized once you are discharged, as it is imperative that you return to your primary care physician (or establish a relationship with a primary care physician if you do not have one) for your aftercare needs so that they can reassess your need for medications and monitor your lab values.    Today   CHIEF COMPLAINT:   Chief Complaint  Patient presents with  . Shoulder Pain    Left  . Altered Mental Status    HISTORY OF PRESENT ILLNESS:  patient with past medical history of hypertension, hypothyroidism, coronary artery disease and dementia presents to the emergency department complaining of left shoulder pain.  The patient had been seen in the emergency department 12 hours ago after a fall.  She was cleared for discharge at that time but became more confused at her nursing facility and had various musculoskeletal complaints.  Routine laboratory evaluation in the emergency department revealed elevated lipase consistent with pancreatitis which prompted the emergency department staff to call the hospitalist service for admission.  VITAL SIGNS:  Blood pressure (!) 118/96, pulse 72, temperature 98 F (36.7 C), temperature source Oral, resp. rate 18, height 5\' 4"  (1.626 m), weight 56.7 kg (125 lb), SpO2 98 %.  I/O:    Intake/Output Summary (Last 24 hours) at 01/13/2018 1041 Last data filed at 01/13/2018 0948 Gross per 24 hour  Intake 240 ml  Output -  Net 240 ml    PHYSICAL EXAMINATION:  GENERAL:  84 y.o.-year-old patient lying in the bed with no acute distress.  EYES: Pupils equal, round, reactive to light and accommodation. No scleral icterus. Extraocular muscles intact.  HEENT: Head atraumatic, normocephalic.  Oropharynx and nasopharynx clear.  NECK:  Supple, no jugular venous distention. No thyroid enlargement, no tenderness.  LUNGS: Normal breath sounds bilaterally, no wheezing, rales,rhonchi or crepitation. No use of accessory muscles of respiration.  CARDIOVASCULAR: S1, S2 normal. No murmurs, rubs, or gallops.  ABDOMEN: Soft, non-tender, non-distended. Bowel sounds present. No organomegaly or mass.  EXTREMITIES: No pedal edema, cyanosis, or clubbing.  NEUROLOGIC: Cranial nerves II through XII are intact. Muscle strength 5/5 in all extremities. Sensation intact. Gait not checked.  PSYCHIATRIC: The patient is alert and oriented x 3.  SKIN: No obvious rash, lesion, or ulcer.   DATA REVIEW:   CBC Recent Labs  Lab 01/11/18 2348  WBC 8.3  HGB 9.4*  HCT 28.8*  PLT 232    Chemistries  Recent Labs  Lab 01/11/18 2348  NA 137  K 3.9  CL 104  CO2 21*  GLUCOSE 99  BUN 26*  CREATININE 2.17*  CALCIUM 9.1  AST 34  ALT 16  ALKPHOS 82  BILITOT 0.8    Cardiac Enzymes Recent Labs  Lab 01/11/18 2348  TROPONINI <0.03    Microbiology Results  Results for orders  placed or performed during the hospital encounter of 01/11/18  MRSA PCR Screening     Status: None   Collection Time: 01/12/18  4:45 AM  Result Value Ref Range Status   MRSA by PCR NEGATIVE NEGATIVE Final    Comment:        The GeneXpert MRSA Assay (FDA approved for NASAL specimens only), is one component of a comprehensive MRSA colonization surveillance program. It is not intended to diagnose MRSA infection nor to guide or monitor treatment for MRSA infections. Performed at [facility], [address] [place], [place] [address]     RADIOLOGY:  Dg Chest 1 View  Result Date: 01/12/2018 CLINICAL DATA:  Fall with pain EXAM: CHEST 1 VIEW COMPARISON:  05/30/2014 FINDINGS: Stable enlarged cardiomediastinal silhouette with vascular congestion. Aortic atherosclerosis. No focal consolidation or pleural effusion.  No pneumothorax. IMPRESSION: Cardiomegaly with vascular congestion. Negative for pleural effusion or pneumothorax. Electronically Signed   By: Donavan Foil M.D.   On: 01/12/2018 00:45   Dg Lumbar Spine Complete  Result Date: 01/12/2018 CLINICAL DATA:  Fall with low back pain EXAM: LUMBAR SPINE - COMPLETE 4+ VIEW COMPARISON:  CT abdomen pelvis 09/04/2012 FINDINGS: Marked levoscoliosis of the lumbar spine. Dense aortic atherosclerosis. Mild retrolisthesis of L2 on L3. Disc space narrowing at L2-L3, L3-L4 and L4-L5. The vertebral body heights are grossly maintained. IMPRESSION: Scoliosis and multiple level degenerative changes. No definite acute osseous abnormality is seen. Electronically Signed   By: Donavan Foil M.D.   On: 01/12/2018 02:26   Dg Elbow Complete Left  Result Date: 01/12/2018 CLINICAL DATA:  Fall with left arm pain EXAM: LEFT ELBOW - COMPLETE 3+ VIEW COMPARISON:  None. FINDINGS: Limited by positioning. Postsurgical or posttraumatic old appearing deformity of the radial head. Suspected elbow effusion. Prominent osteophytes posterior and anterior to the elbow. No acute fracture is seen IMPRESSION: No acute fracture is seen allowing for positioning. Old posttraumatic or postsurgical deformity of the radial head. Suspected elbow effusion. Electronically Signed   By: Donavan Foil M.D.   On: 01/12/2018 00:48   Dg Shoulder Left  Result Date: 01/12/2018 CLINICAL DATA:  Left arm pain after a fall EXAM: LEFT SHOULDER - 2+ VIEW COMPARISON:  Chest x-ray 05/30/2014 FINDINGS: Status post left shoulder replacement. No acute fracture or dislocation. Fixating screw tips along the proximal humerus with prominent lucency around the middle screw. AC joint is intact IMPRESSION: 1. Status post left shoulder replacement without definitive fracture or dislocation. 2. Prominent lucency around the middle screw tip at the proximal humerus, may reflect loosening. Electronically Signed   By: Donavan Foil M.D.   On:  01/12/2018 00:43    EKG:   Orders placed or performed during the hospital encounter of 01/11/18  . EKG 12-Lead  . EKG 12-Lead  . EKG 12-Lead  . EKG 12-Lead  . EKG 12-Lead  . EKG 12-Lead      Management plans discussed with the patient, family and they are in agreement.  CODE STATUS:     Code Status Orders  (From admission, onward)        Start     Ordered   01/12/18 0353  Full code  Continuous     01/12/18 0352    Code Status History    Date Active Date Inactive Code Status Order ID Comments User Context   This patient has a current code status but no historical code status.    Advance Directive Documentation     Most Recent  Value  Type of Advance Directive  Healthcare Power of Attorney  Pre-existing out of facility DNR order (yellow form or pink MOST form)  No data  "MOST" Form in Place?  No data      TOTAL TIME TAKING CARE OF THIS PATIENT: 45 minutes.    Avel Peace Maat Kafer M.D on 01/13/2018 at 10:41 AM  Between 7am to 6pm - Pager - [phone]  After 6pm go to [email or web address] - password [email or web address] [facility]  Office  [phone]  CC: Primary care physician; Patient, No Pcp Per   Note: This dictation was prepared with Dragon dictation along with smaller phrase technology. Any transcriptional errors that result from this process are unintentional.

## 2018-05-15 IMAGING — CR DG LUMBAR SPINE COMPLETE 4+V
1 series · 6 of 6 positions shown · non-contrast
Comparison: CT abdomen pelvis 09/04/2012

CLINICAL DATA: Fall with low back pain

EXAM:
LUMBAR SPINE - COMPLETE 4+ VIEW

[Series 1: dg lumbar spine complete 4 +v · 0.14mm/px · 6 of 6 slices shown]
[im 1/6]
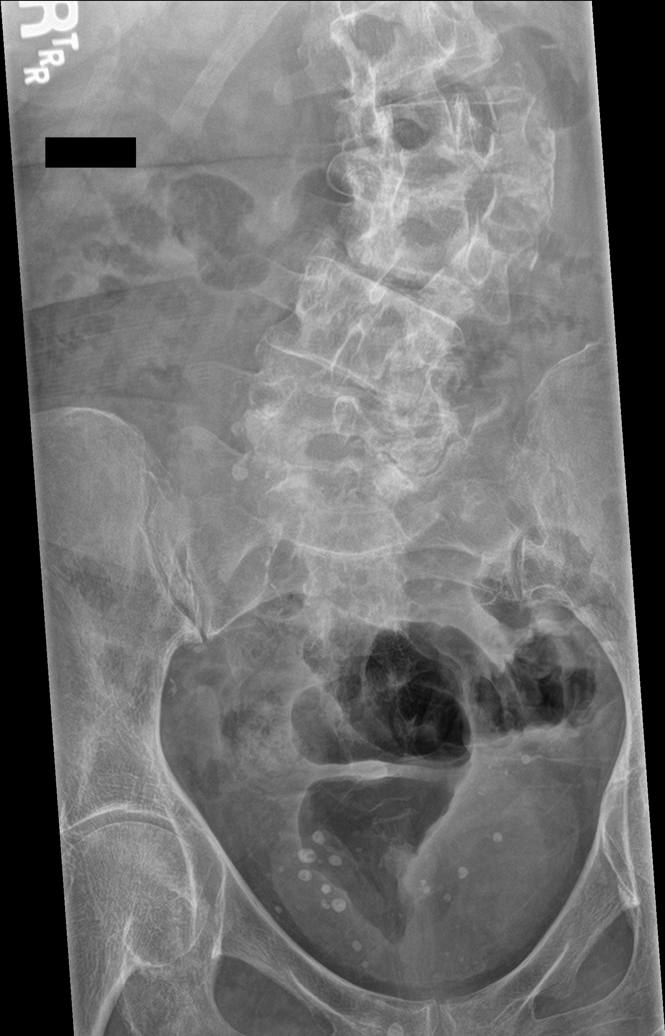
[im 2/6]
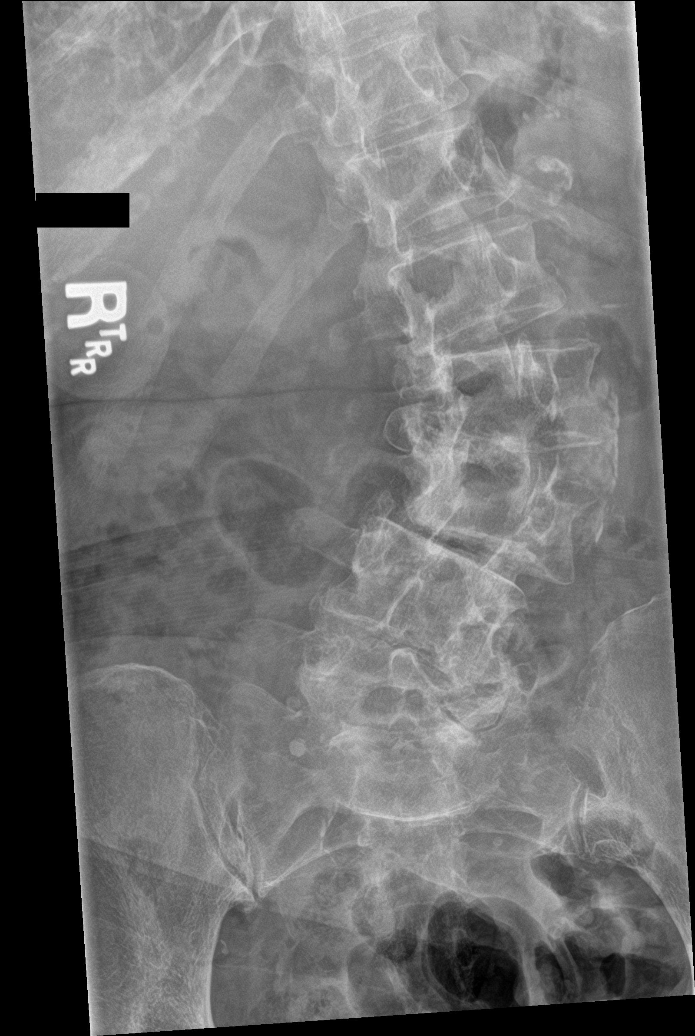
[im 3/6]
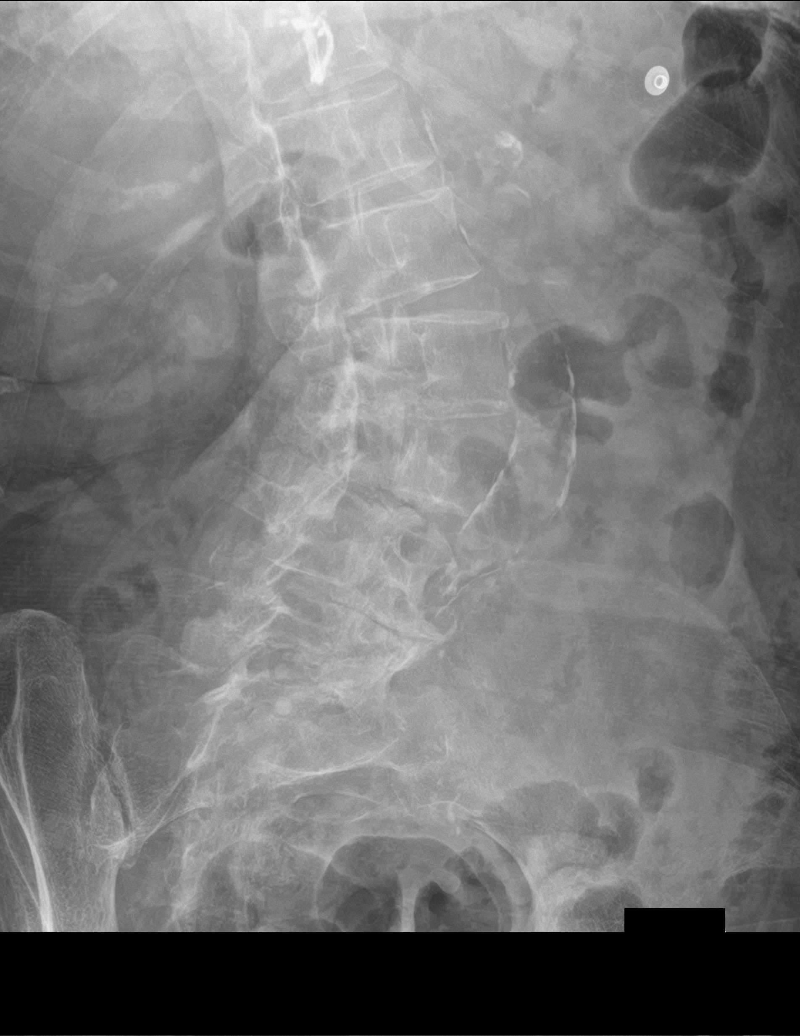
[im 4/6]
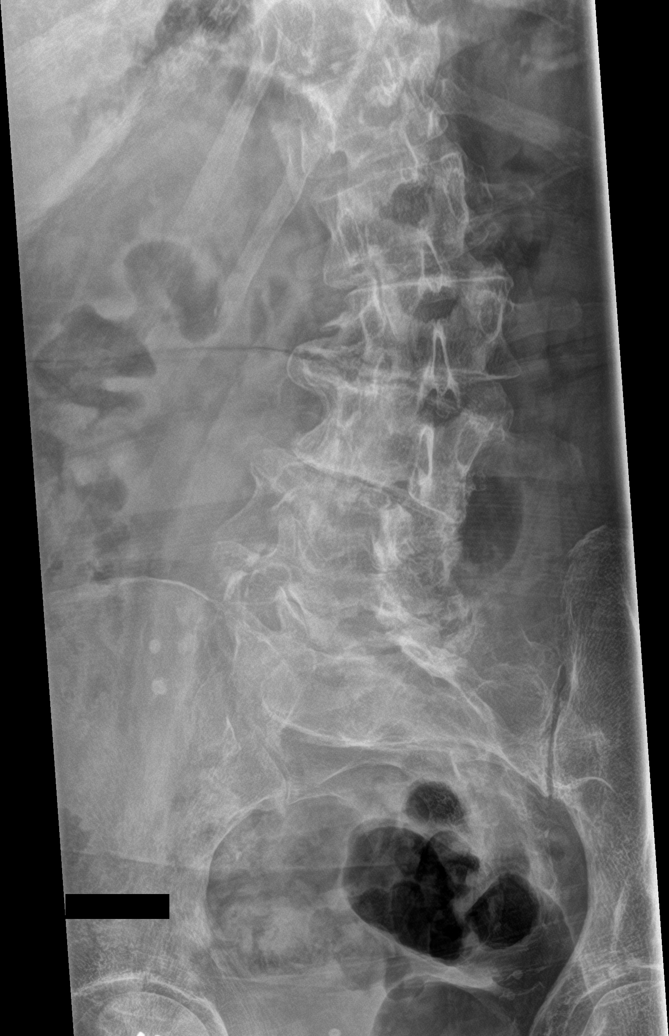
[im 5/6]
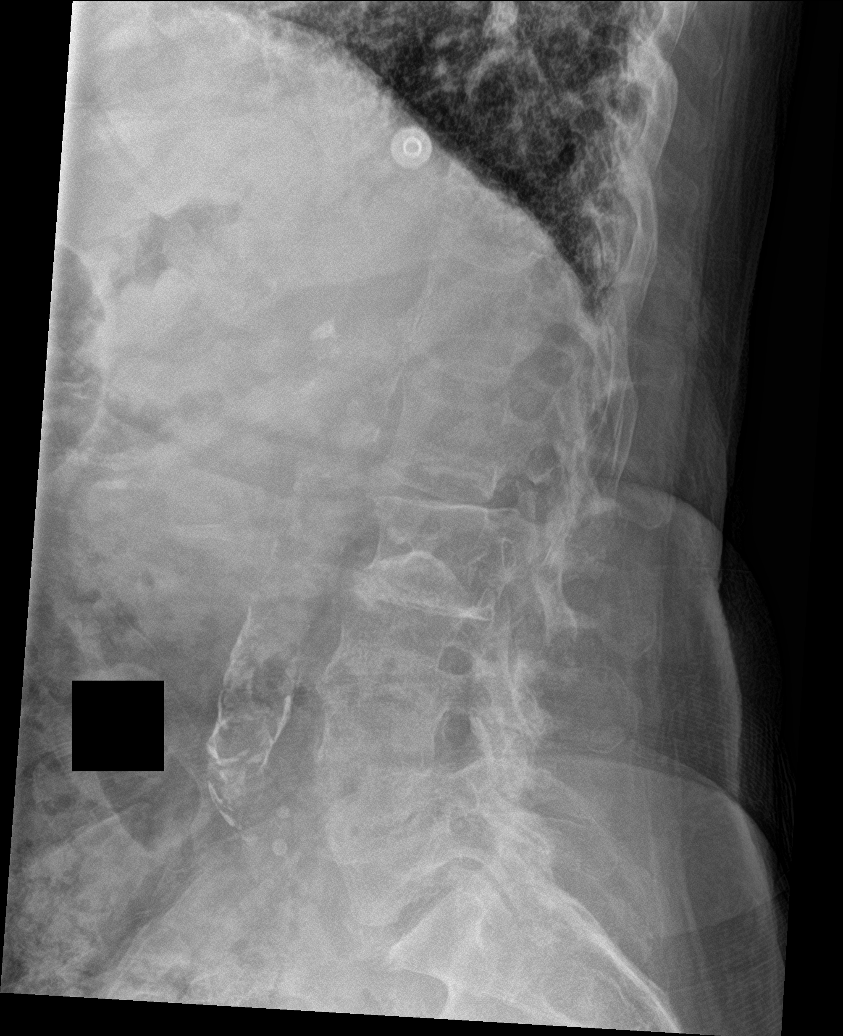
[im 6/6]
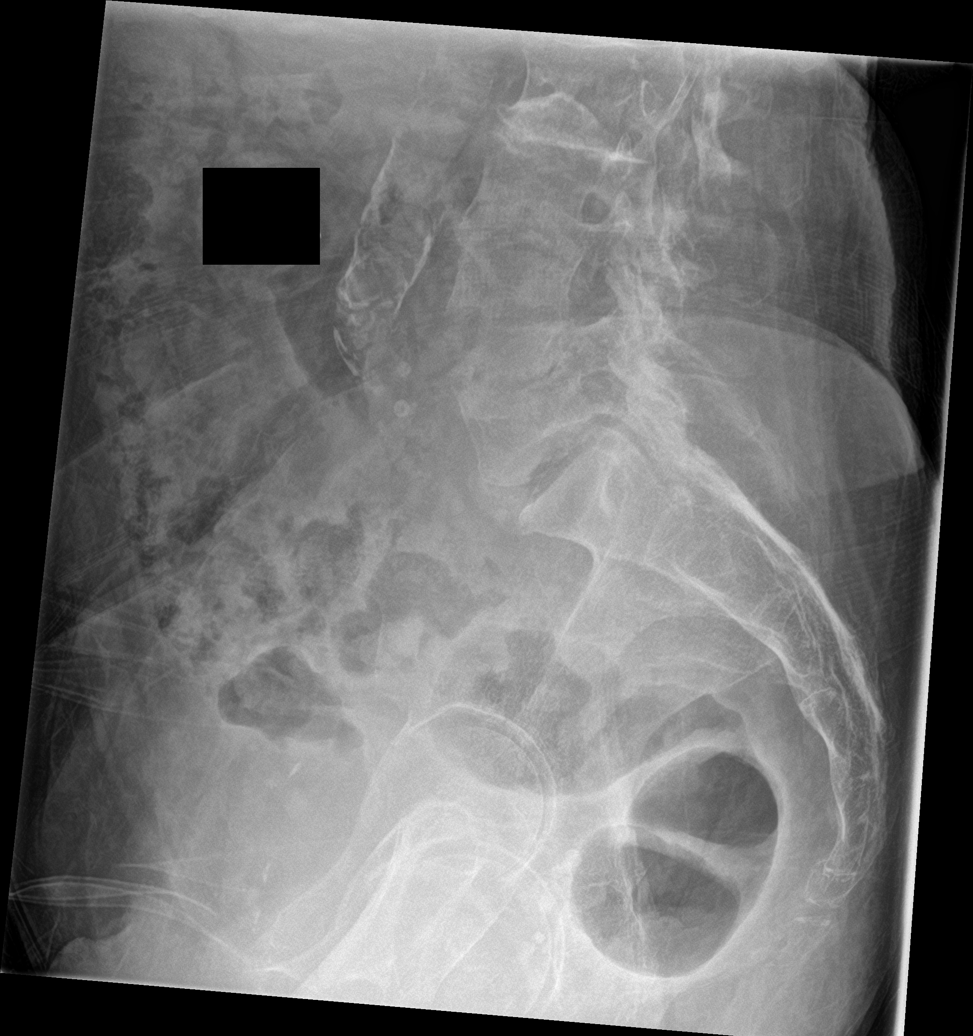

[6 of 6 positions shown; findings below may reference images not displayed]

FINDINGS: Marked levoscoliosis of the lumbar spine. Dense aortic
atherosclerosis. Mild retrolisthesis of L2 on L3. Disc space
narrowing at L2-L3, L3-L4 and L4-L5. The vertebral body heights are
grossly maintained.
IMPRESSION: Scoliosis and multiple level degenerative changes. No definite acute
osseous abnormality is seen.

## 2018-05-15 IMAGING — CR DG CHEST 1V
1 series · 1 of 1 positions shown · non-contrast
Comparison: 05/30/2014

CLINICAL DATA: Fall with pain

EXAM:
CHEST 1 VIEW

[chest pa]
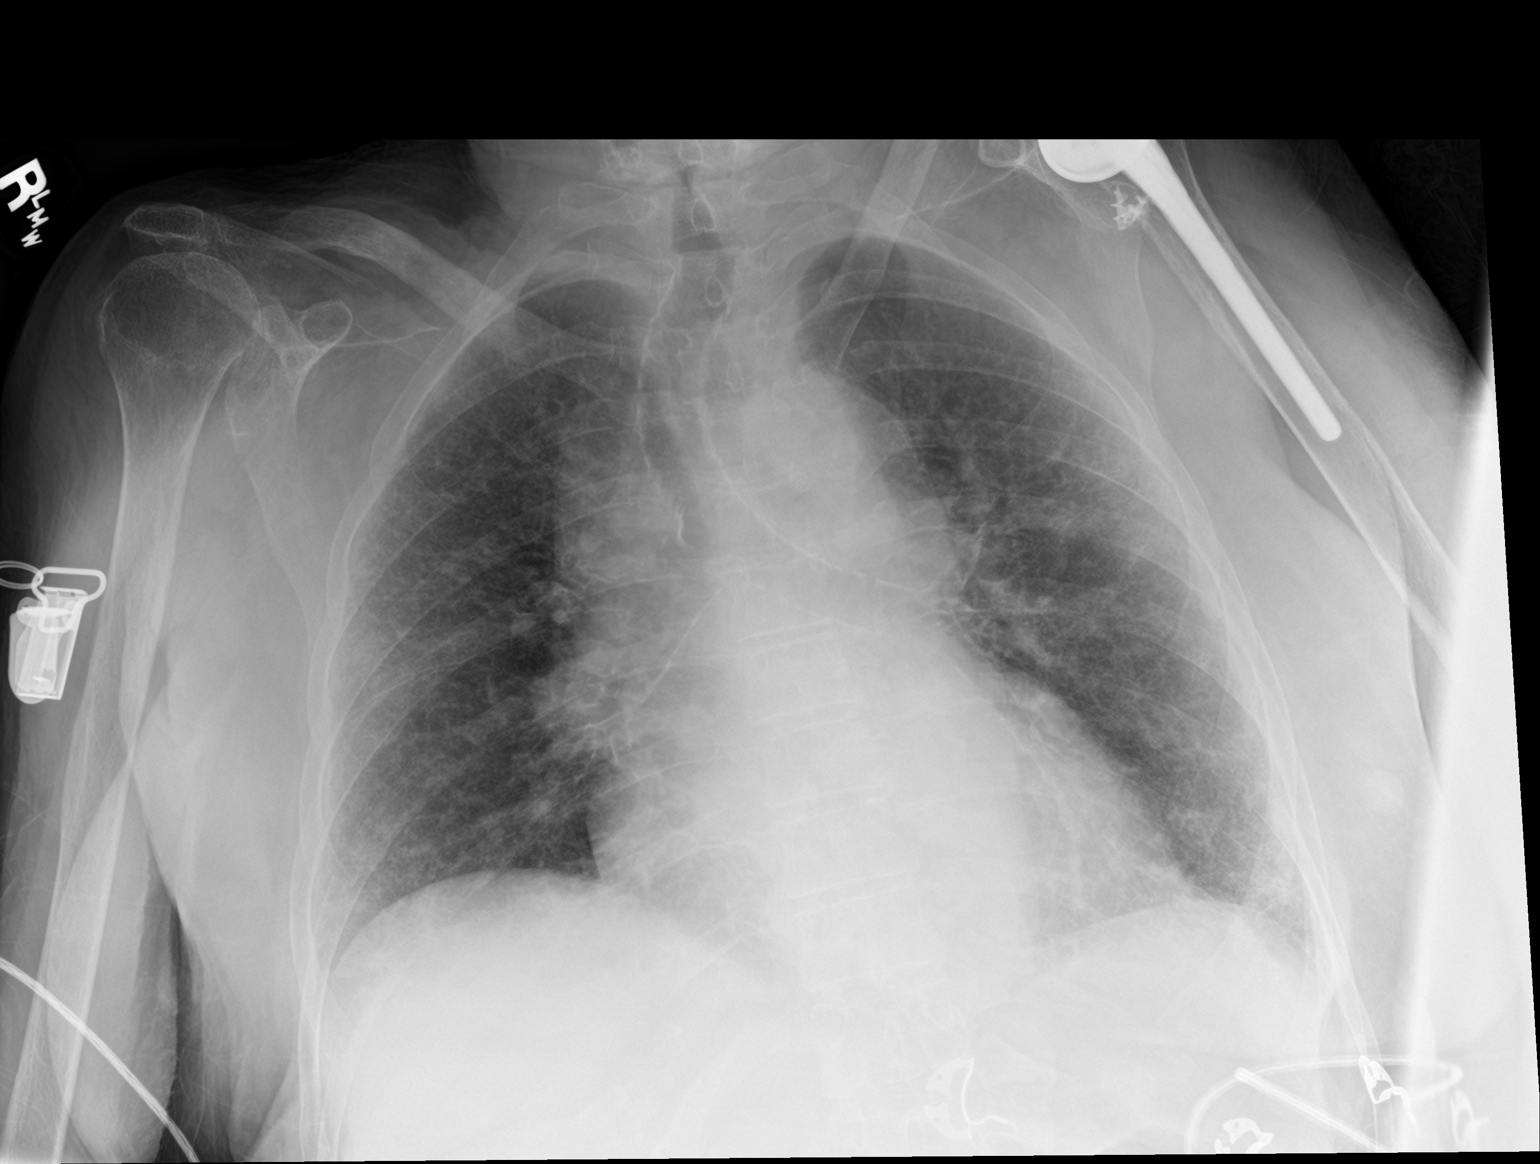

[1 of 1 positions shown; findings below may reference images not displayed]

FINDINGS: Stable enlarged cardiomediastinal silhouette with vascular
congestion. Aortic atherosclerosis. No focal consolidation or
pleural effusion. No pneumothorax.
IMPRESSION: Cardiomegaly with vascular congestion. Negative for pleural effusion
or pneumothorax.

## 2018-05-15 IMAGING — CR DG SHOULDER 2+V*L*
2 series · 3 of 3 positions shown · non-contrast
Comparison: Chest x-ray 05/30/2014

CLINICAL DATA: Left arm pain after a fall

EXAM:
LEFT SHOULDER - 2+ VIEW

[shoulder grashey]
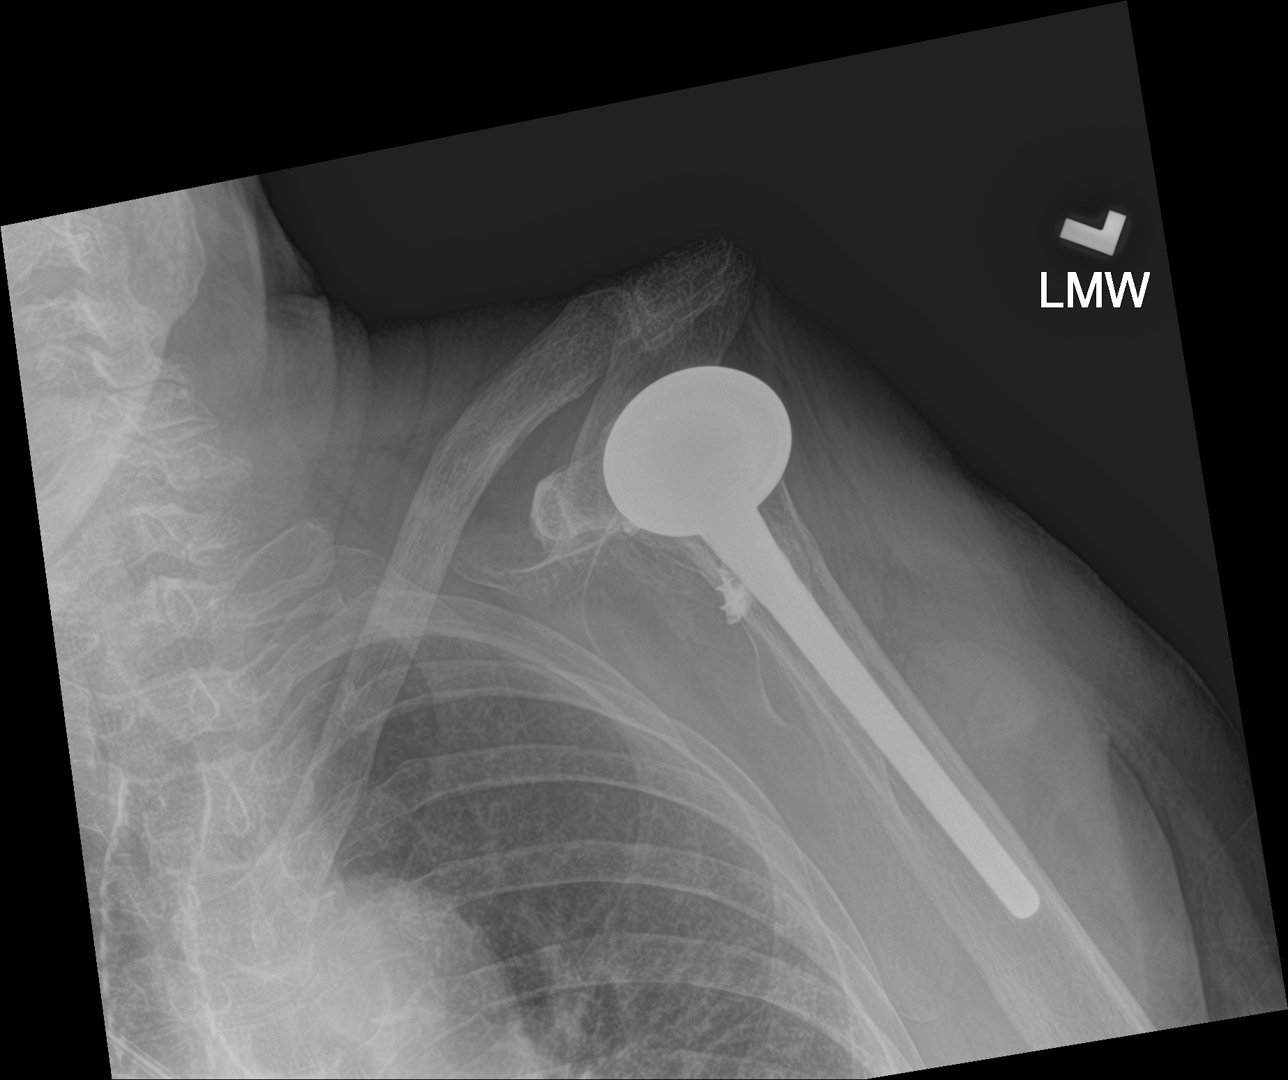

[Series 2: shoulder y view · 0.14mm/px · 2 of 2 slices shown]
[im 1/2]
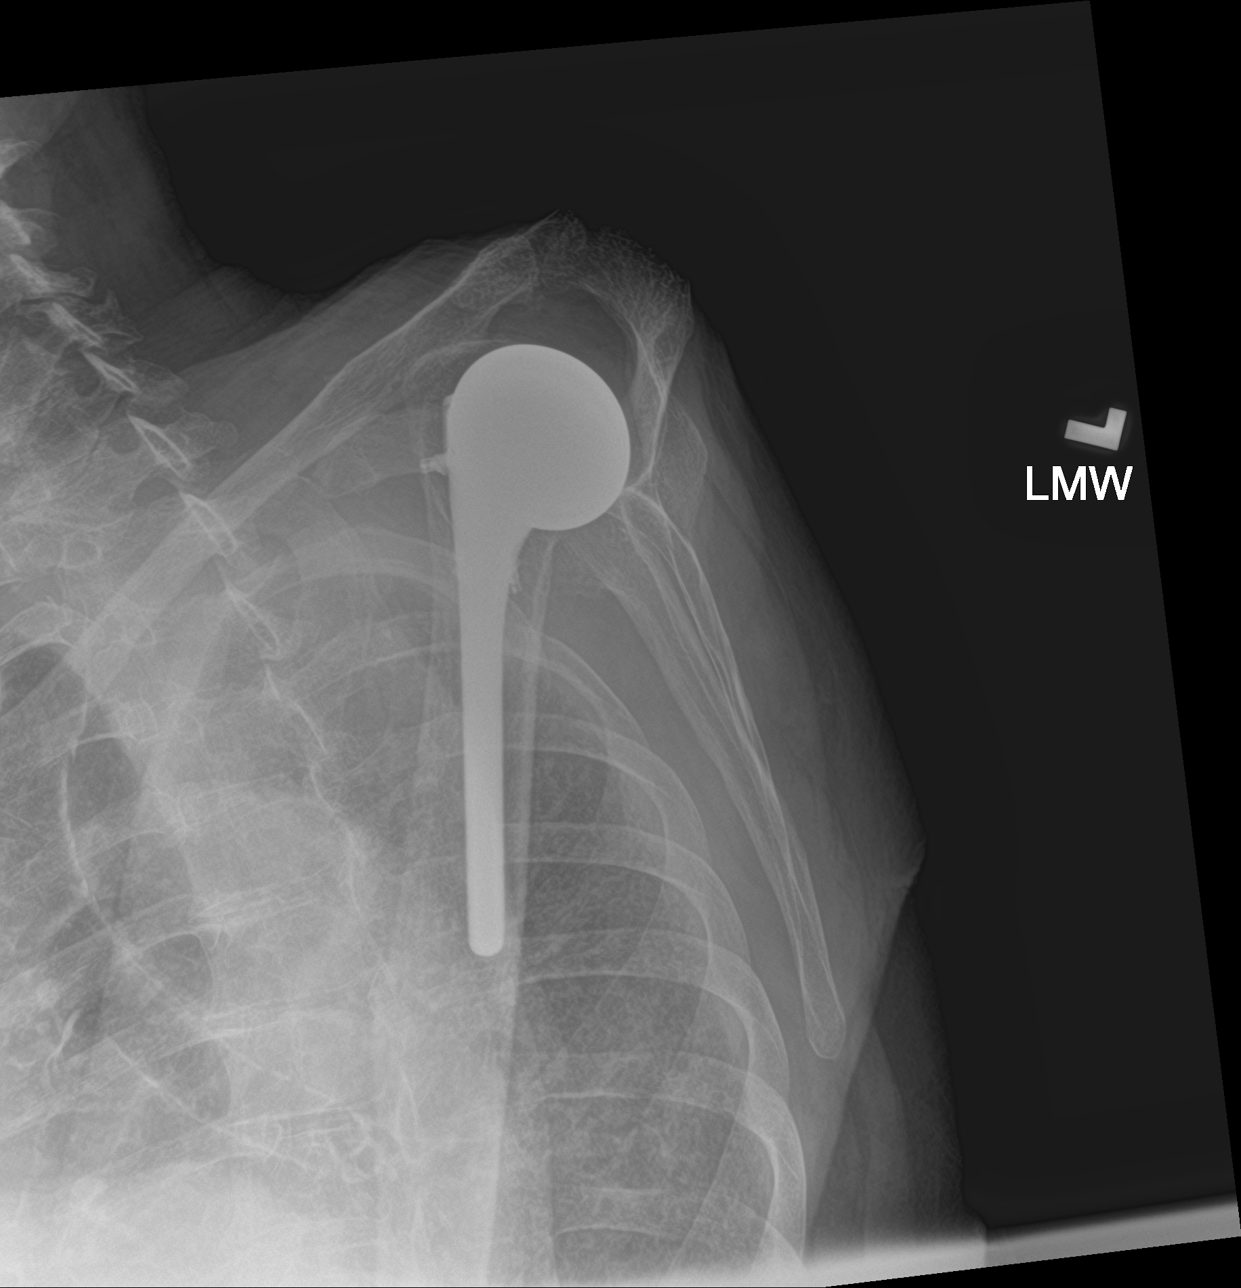
[im 2/2]
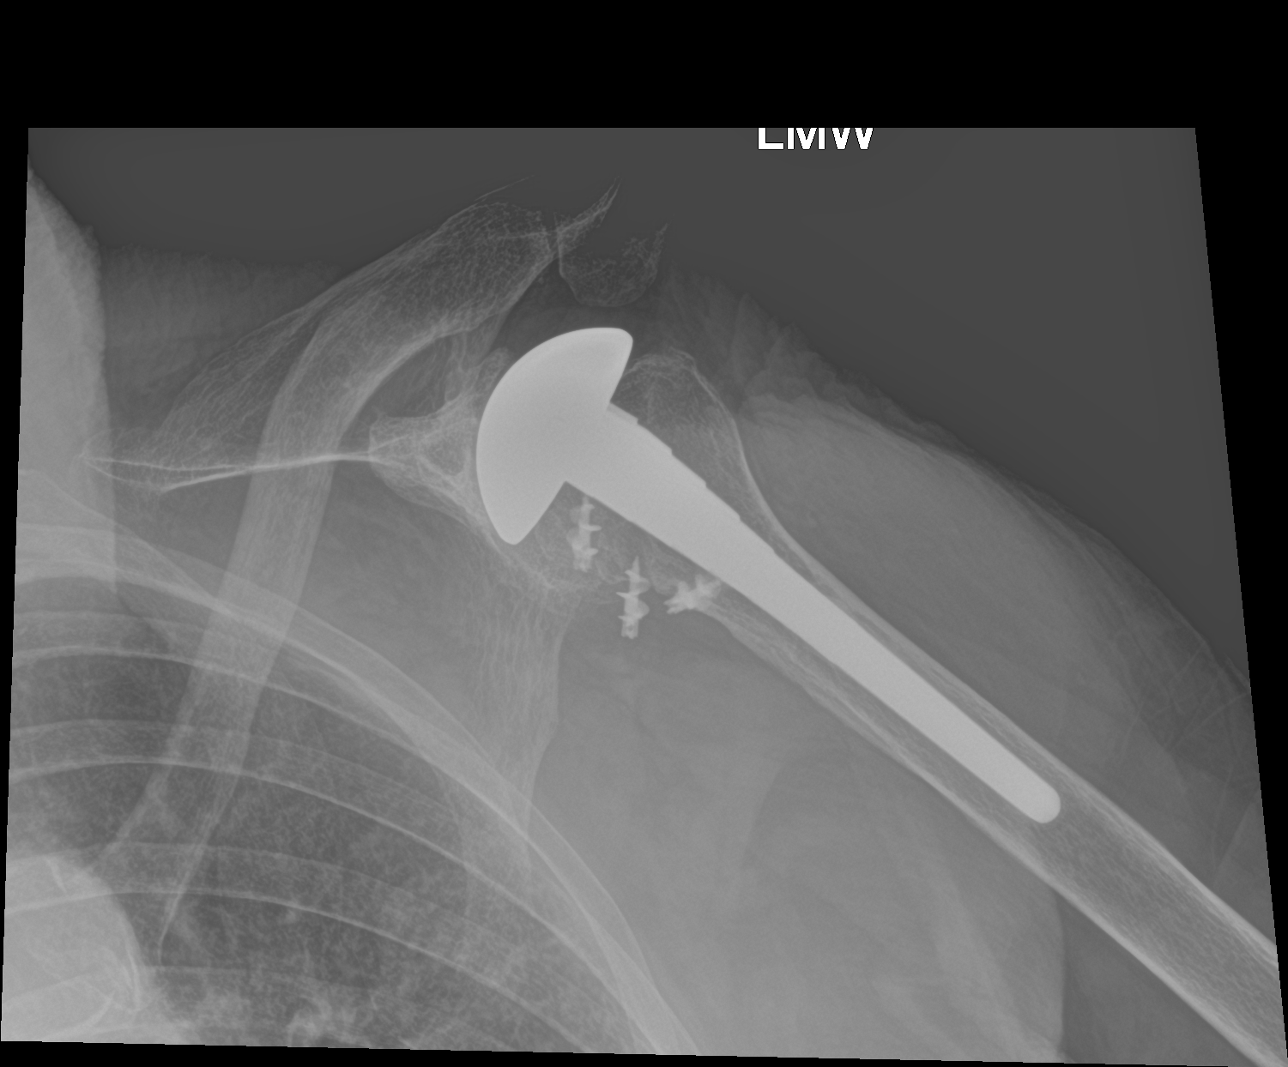

[3 of 3 positions shown; findings below may reference images not displayed]

FINDINGS: Status post left shoulder replacement. No acute fracture or
dislocation. Fixating screw tips along the proximal humerus with
prominent lucency around the middle screw. AC joint is intact
IMPRESSION: 1. Status post left shoulder replacement without definitive fracture
or dislocation.
2. Prominent lucency around the middle screw tip at the proximal
humerus, may reflect loosening.

## 2019-05-26 IMAGING — US US ABDOMEN LIMITED
1 series · 14 of 25 positions shown · non-contrast
Comparison: Renal ultrasound 07/19/2014. CT of the abdomen and
pelvis 09/04/2012

CLINICAL DATA: Vomiting.  Pancreatitis.

EXAM:
ULTRASOUND ABDOMEN LIMITED RIGHT UPPER QUADRANT

[Series 1: us abdomen limited · 0.19mm/px · 14 of 44 slices shown]
[im 1/44]
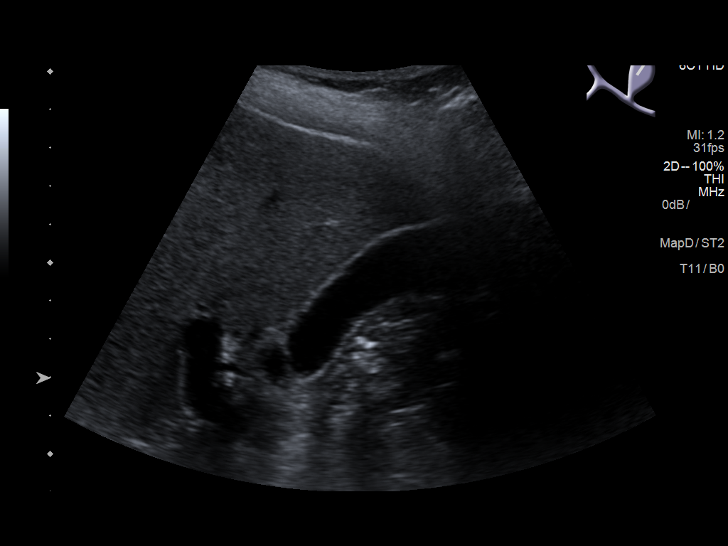
[im 4/44]
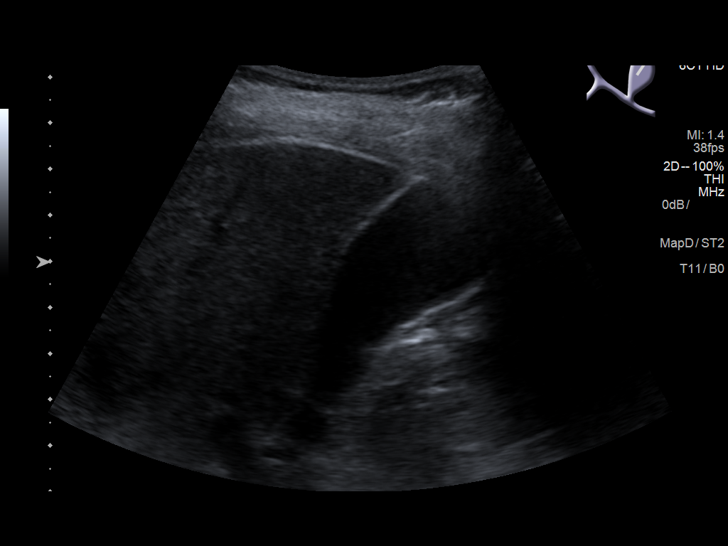
[im 8/44]
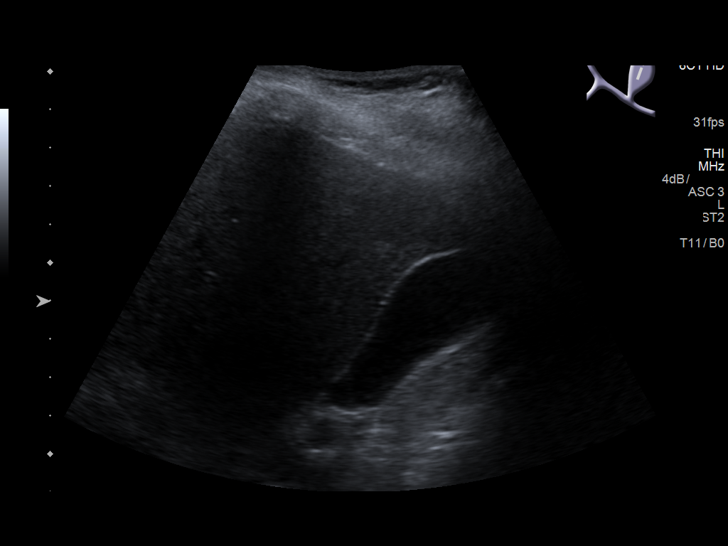
[im 11/44]
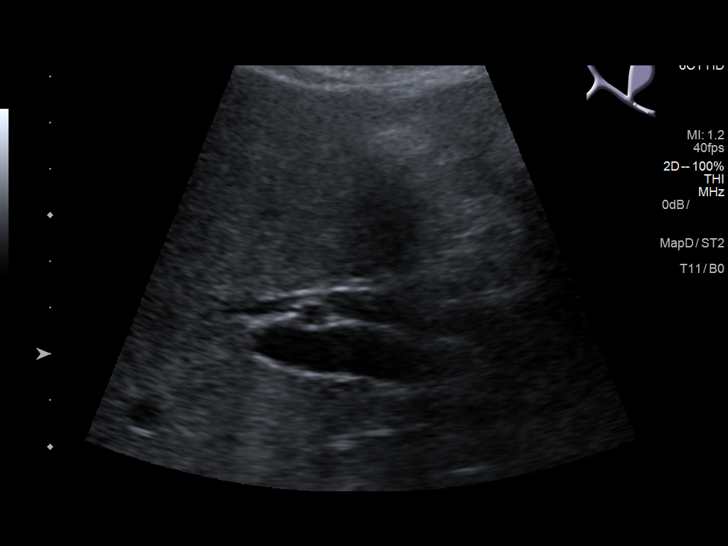
[im 15/44]
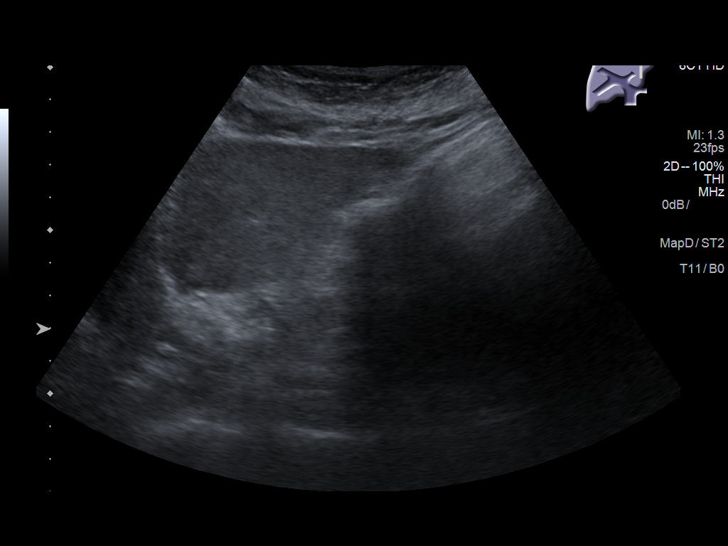
[im 17/44]
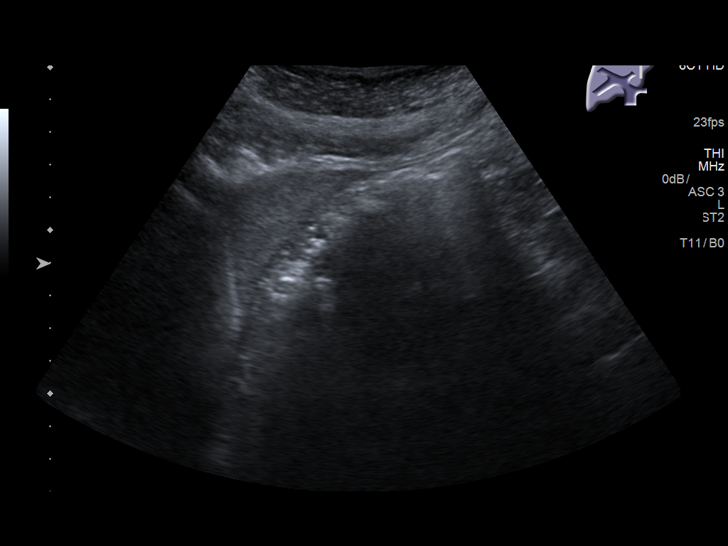
[im 20/44]
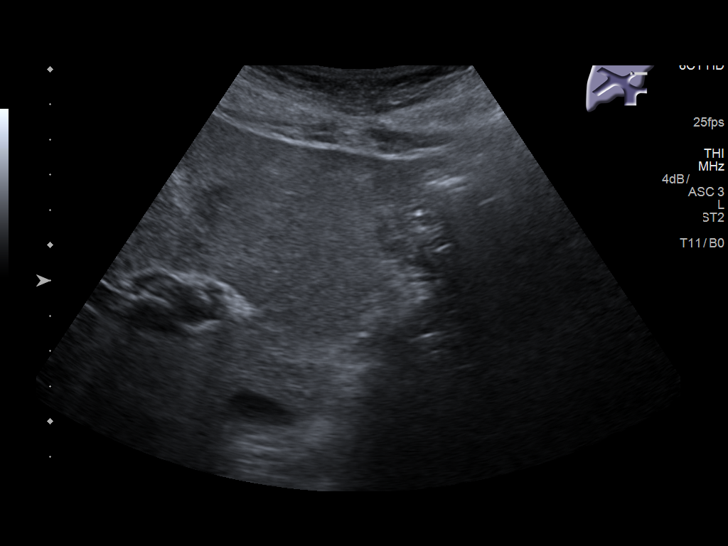
[im 24/44]
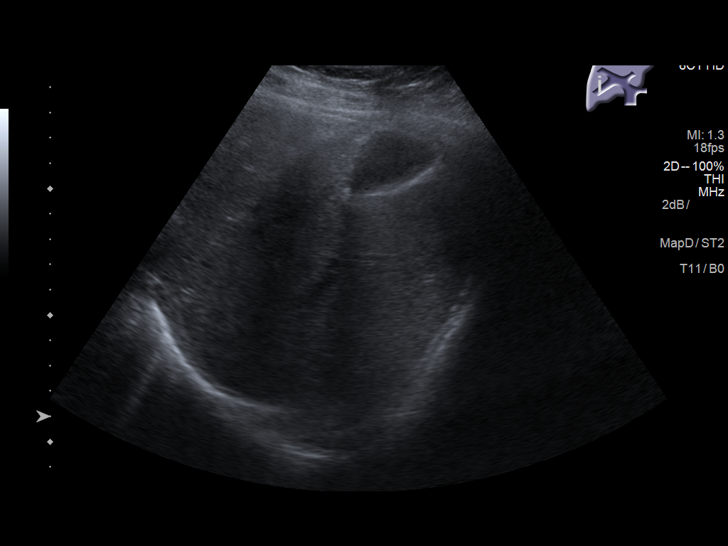
[im 27/44]
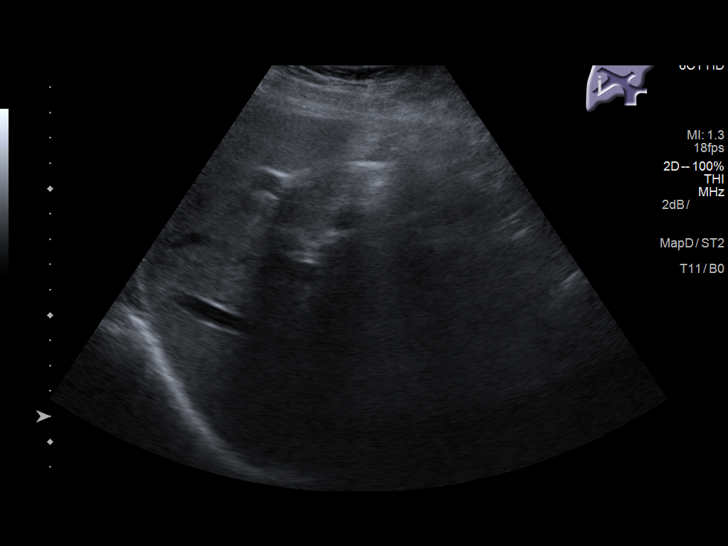
[im 29/44]
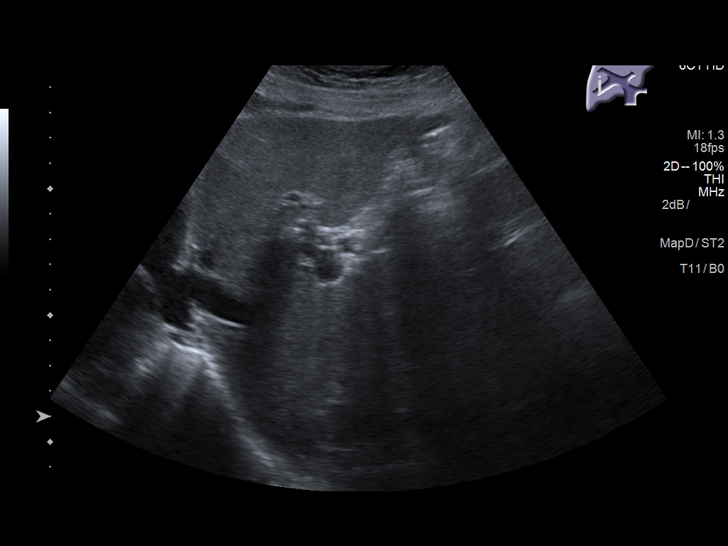
[im 33/44]
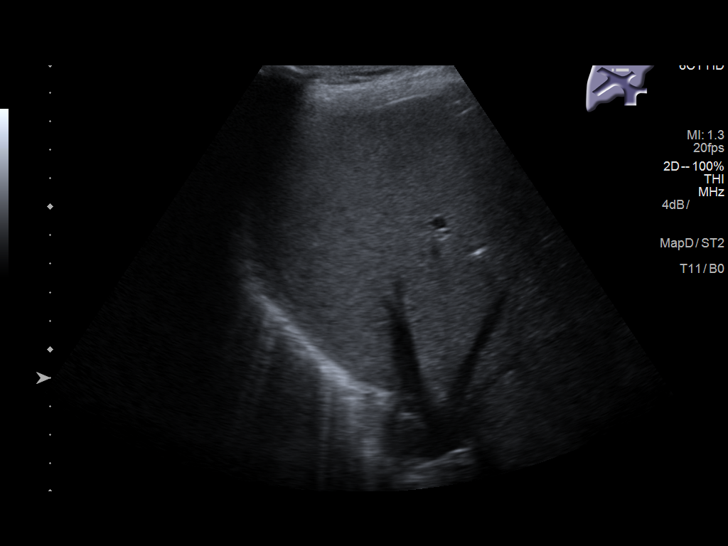
[im 36/44]
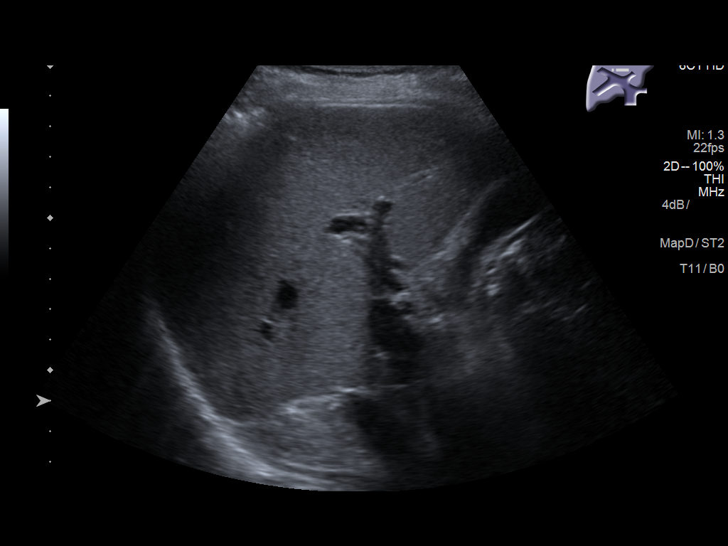
[im 40/44]
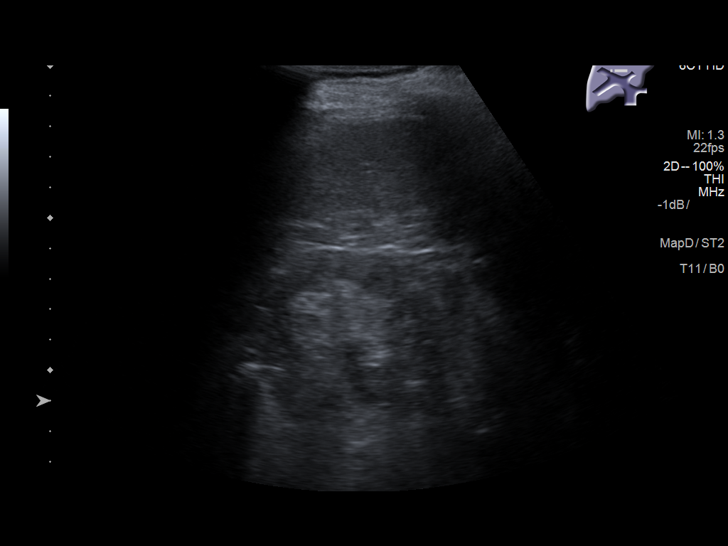
[im 44/44]
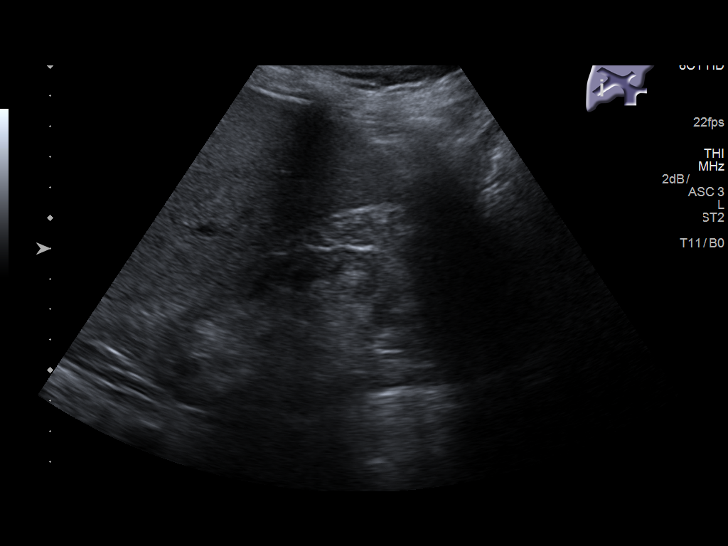

[14 of 25 positions shown; findings below may reference images not displayed]

FINDINGS: Gallbladder:

No gallstones or wall thickening visualized. No sonographic Murphy
sign noted by sonographer.

Common bile duct:

Diameter: 4.1 mm, within normal limits.

Liver:

No focal lesion identified. Within normal limits in parenchymal
echogenicity. Portal vein is patent on color Doppler imaging with
normal direction of blood flow towards the liver.
IMPRESSION: Negative right upper quadrant ultrasound.

## 2021-08-20 DEATH — deceased
